# Patient Record
Sex: Female | Born: 1960 | Race: White | Hispanic: No | Marital: Married
Health system: Southern US, Community
[De-identification: ages and names within clinical notes are randomized; demographics above are authoritative.]

---

## 1998-01-27 ENCOUNTER — Other Ambulatory Visit: Admission: RE | Admit: 1998-01-27 | Discharge: 1998-01-27 | Payer: Self-pay | Admitting: Obstetrics and Gynecology

## 1999-02-13 ENCOUNTER — Other Ambulatory Visit: Admission: RE | Admit: 1999-02-13 | Discharge: 1999-02-13 | Payer: Self-pay | Admitting: Obstetrics and Gynecology

## 2000-04-15 ENCOUNTER — Other Ambulatory Visit: Admission: RE | Admit: 2000-04-15 | Discharge: 2000-04-15 | Payer: Self-pay | Admitting: Obstetrics and Gynecology

## 2001-04-28 ENCOUNTER — Other Ambulatory Visit: Admission: RE | Admit: 2001-04-28 | Discharge: 2001-04-28 | Payer: Self-pay | Admitting: Obstetrics and Gynecology

## 2002-04-29 ENCOUNTER — Other Ambulatory Visit: Admission: RE | Admit: 2002-04-29 | Discharge: 2002-04-29 | Payer: Self-pay | Admitting: Obstetrics and Gynecology

## 2003-05-04 ENCOUNTER — Other Ambulatory Visit: Admission: RE | Admit: 2003-05-04 | Discharge: 2003-05-04 | Payer: Self-pay | Admitting: Obstetrics and Gynecology

## 2003-05-05 ENCOUNTER — Ambulatory Visit (HOSPITAL_COMMUNITY): Admission: RE | Admit: 2003-05-05 | Discharge: 2003-05-05 | Payer: Self-pay | Admitting: Obstetrics and Gynecology

## 2004-05-30 ENCOUNTER — Other Ambulatory Visit: Admission: RE | Admit: 2004-05-30 | Discharge: 2004-05-30 | Payer: Self-pay | Admitting: Obstetrics and Gynecology

## 2005-07-09 ENCOUNTER — Ambulatory Visit (HOSPITAL_COMMUNITY): Admission: RE | Admit: 2005-07-09 | Discharge: 2005-07-09 | Payer: Self-pay | Admitting: Obstetrics and Gynecology

## 2015-12-14 DIAGNOSIS — G43009 Migraine without aura, not intractable, without status migrainosus: Secondary | ICD-10-CM | POA: Insufficient documentation

## 2015-12-14 DIAGNOSIS — G4733 Obstructive sleep apnea (adult) (pediatric): Secondary | ICD-10-CM | POA: Insufficient documentation

## 2017-09-26 ENCOUNTER — Ambulatory Visit (INDEPENDENT_AMBULATORY_CARE_PROVIDER_SITE_OTHER): Payer: BLUE CROSS/BLUE SHIELD | Admitting: Licensed Clinical Social Worker

## 2017-09-26 DIAGNOSIS — F419 Anxiety disorder, unspecified: Secondary | ICD-10-CM | POA: Diagnosis not present

## 2017-10-07 ENCOUNTER — Ambulatory Visit (INDEPENDENT_AMBULATORY_CARE_PROVIDER_SITE_OTHER): Payer: BLUE CROSS/BLUE SHIELD | Admitting: Licensed Clinical Social Worker

## 2017-10-07 DIAGNOSIS — F419 Anxiety disorder, unspecified: Secondary | ICD-10-CM

## 2017-10-22 ENCOUNTER — Ambulatory Visit (INDEPENDENT_AMBULATORY_CARE_PROVIDER_SITE_OTHER): Payer: BLUE CROSS/BLUE SHIELD | Admitting: Licensed Clinical Social Worker

## 2017-10-22 DIAGNOSIS — F419 Anxiety disorder, unspecified: Secondary | ICD-10-CM | POA: Diagnosis not present

## 2017-11-05 ENCOUNTER — Ambulatory Visit (INDEPENDENT_AMBULATORY_CARE_PROVIDER_SITE_OTHER): Payer: BLUE CROSS/BLUE SHIELD | Admitting: Licensed Clinical Social Worker

## 2017-11-05 DIAGNOSIS — F419 Anxiety disorder, unspecified: Secondary | ICD-10-CM | POA: Diagnosis not present

## 2017-11-28 ENCOUNTER — Ambulatory Visit (INDEPENDENT_AMBULATORY_CARE_PROVIDER_SITE_OTHER): Payer: BLUE CROSS/BLUE SHIELD | Admitting: Licensed Clinical Social Worker

## 2017-11-28 DIAGNOSIS — F419 Anxiety disorder, unspecified: Secondary | ICD-10-CM | POA: Diagnosis not present

## 2017-12-11 ENCOUNTER — Ambulatory Visit (INDEPENDENT_AMBULATORY_CARE_PROVIDER_SITE_OTHER): Payer: BLUE CROSS/BLUE SHIELD | Admitting: Licensed Clinical Social Worker

## 2017-12-11 DIAGNOSIS — F419 Anxiety disorder, unspecified: Secondary | ICD-10-CM

## 2017-12-24 ENCOUNTER — Ambulatory Visit (INDEPENDENT_AMBULATORY_CARE_PROVIDER_SITE_OTHER): Payer: BLUE CROSS/BLUE SHIELD | Admitting: Licensed Clinical Social Worker

## 2017-12-24 DIAGNOSIS — F419 Anxiety disorder, unspecified: Secondary | ICD-10-CM | POA: Diagnosis not present

## 2018-01-07 ENCOUNTER — Ambulatory Visit (INDEPENDENT_AMBULATORY_CARE_PROVIDER_SITE_OTHER): Payer: BLUE CROSS/BLUE SHIELD | Admitting: Licensed Clinical Social Worker

## 2018-01-07 DIAGNOSIS — F419 Anxiety disorder, unspecified: Secondary | ICD-10-CM | POA: Diagnosis not present

## 2018-01-21 ENCOUNTER — Ambulatory Visit (INDEPENDENT_AMBULATORY_CARE_PROVIDER_SITE_OTHER): Payer: BLUE CROSS/BLUE SHIELD | Admitting: Licensed Clinical Social Worker

## 2018-01-21 DIAGNOSIS — F419 Anxiety disorder, unspecified: Secondary | ICD-10-CM | POA: Diagnosis not present

## 2018-02-04 ENCOUNTER — Ambulatory Visit (INDEPENDENT_AMBULATORY_CARE_PROVIDER_SITE_OTHER): Payer: BLUE CROSS/BLUE SHIELD | Admitting: Licensed Clinical Social Worker

## 2018-02-04 DIAGNOSIS — F419 Anxiety disorder, unspecified: Secondary | ICD-10-CM

## 2018-02-25 ENCOUNTER — Ambulatory Visit (INDEPENDENT_AMBULATORY_CARE_PROVIDER_SITE_OTHER): Payer: BLUE CROSS/BLUE SHIELD | Admitting: Licensed Clinical Social Worker

## 2018-02-25 DIAGNOSIS — F419 Anxiety disorder, unspecified: Secondary | ICD-10-CM

## 2018-03-13 ENCOUNTER — Ambulatory Visit (INDEPENDENT_AMBULATORY_CARE_PROVIDER_SITE_OTHER): Payer: BLUE CROSS/BLUE SHIELD | Admitting: Licensed Clinical Social Worker

## 2018-03-13 DIAGNOSIS — F419 Anxiety disorder, unspecified: Secondary | ICD-10-CM

## 2018-03-25 ENCOUNTER — Ambulatory Visit (INDEPENDENT_AMBULATORY_CARE_PROVIDER_SITE_OTHER): Payer: BLUE CROSS/BLUE SHIELD | Admitting: Licensed Clinical Social Worker

## 2018-03-25 DIAGNOSIS — F419 Anxiety disorder, unspecified: Secondary | ICD-10-CM

## 2018-04-30 ENCOUNTER — Ambulatory Visit (INDEPENDENT_AMBULATORY_CARE_PROVIDER_SITE_OTHER): Payer: BLUE CROSS/BLUE SHIELD | Admitting: Licensed Clinical Social Worker

## 2018-04-30 DIAGNOSIS — F419 Anxiety disorder, unspecified: Secondary | ICD-10-CM

## 2018-05-28 ENCOUNTER — Ambulatory Visit (INDEPENDENT_AMBULATORY_CARE_PROVIDER_SITE_OTHER): Payer: BLUE CROSS/BLUE SHIELD | Admitting: Licensed Clinical Social Worker

## 2018-05-28 DIAGNOSIS — F419 Anxiety disorder, unspecified: Secondary | ICD-10-CM | POA: Diagnosis not present

## 2018-06-25 ENCOUNTER — Ambulatory Visit (INDEPENDENT_AMBULATORY_CARE_PROVIDER_SITE_OTHER): Payer: PRIVATE HEALTH INSURANCE | Admitting: Licensed Clinical Social Worker

## 2018-06-25 DIAGNOSIS — F419 Anxiety disorder, unspecified: Secondary | ICD-10-CM

## 2018-07-23 ENCOUNTER — Ambulatory Visit (INDEPENDENT_AMBULATORY_CARE_PROVIDER_SITE_OTHER): Payer: PRIVATE HEALTH INSURANCE | Admitting: Licensed Clinical Social Worker

## 2018-07-23 DIAGNOSIS — F419 Anxiety disorder, unspecified: Secondary | ICD-10-CM

## 2018-08-21 ENCOUNTER — Ambulatory Visit: Payer: Self-pay | Admitting: Licensed Clinical Social Worker

## 2018-08-27 ENCOUNTER — Ambulatory Visit (INDEPENDENT_AMBULATORY_CARE_PROVIDER_SITE_OTHER): Payer: PRIVATE HEALTH INSURANCE | Admitting: Licensed Clinical Social Worker

## 2018-08-27 DIAGNOSIS — F419 Anxiety disorder, unspecified: Secondary | ICD-10-CM

## 2018-09-24 ENCOUNTER — Ambulatory Visit: Payer: Self-pay | Admitting: Licensed Clinical Social Worker

## 2018-09-24 ENCOUNTER — Ambulatory Visit (INDEPENDENT_AMBULATORY_CARE_PROVIDER_SITE_OTHER): Payer: PRIVATE HEALTH INSURANCE | Admitting: Licensed Clinical Social Worker

## 2018-09-24 DIAGNOSIS — F419 Anxiety disorder, unspecified: Secondary | ICD-10-CM

## 2018-10-22 ENCOUNTER — Ambulatory Visit (INDEPENDENT_AMBULATORY_CARE_PROVIDER_SITE_OTHER): Payer: PRIVATE HEALTH INSURANCE | Admitting: Licensed Clinical Social Worker

## 2018-10-22 DIAGNOSIS — F3341 Major depressive disorder, recurrent, in partial remission: Secondary | ICD-10-CM | POA: Diagnosis not present

## 2018-11-26 ENCOUNTER — Ambulatory Visit (INDEPENDENT_AMBULATORY_CARE_PROVIDER_SITE_OTHER): Payer: PRIVATE HEALTH INSURANCE | Admitting: Licensed Clinical Social Worker

## 2018-11-26 DIAGNOSIS — F3341 Major depressive disorder, recurrent, in partial remission: Secondary | ICD-10-CM

## 2018-12-24 ENCOUNTER — Ambulatory Visit (INDEPENDENT_AMBULATORY_CARE_PROVIDER_SITE_OTHER): Payer: PRIVATE HEALTH INSURANCE | Admitting: Licensed Clinical Social Worker

## 2018-12-24 DIAGNOSIS — F331 Major depressive disorder, recurrent, moderate: Secondary | ICD-10-CM | POA: Diagnosis not present

## 2019-01-28 ENCOUNTER — Ambulatory Visit (INDEPENDENT_AMBULATORY_CARE_PROVIDER_SITE_OTHER): Payer: PRIVATE HEALTH INSURANCE | Admitting: Licensed Clinical Social Worker

## 2019-01-28 DIAGNOSIS — F3341 Major depressive disorder, recurrent, in partial remission: Secondary | ICD-10-CM

## 2019-02-25 ENCOUNTER — Ambulatory Visit (INDEPENDENT_AMBULATORY_CARE_PROVIDER_SITE_OTHER): Payer: PRIVATE HEALTH INSURANCE | Admitting: Licensed Clinical Social Worker

## 2019-02-25 DIAGNOSIS — F3341 Major depressive disorder, recurrent, in partial remission: Secondary | ICD-10-CM | POA: Diagnosis not present

## 2019-03-25 ENCOUNTER — Ambulatory Visit (INDEPENDENT_AMBULATORY_CARE_PROVIDER_SITE_OTHER): Payer: PRIVATE HEALTH INSURANCE | Admitting: Licensed Clinical Social Worker

## 2019-03-25 DIAGNOSIS — F3341 Major depressive disorder, recurrent, in partial remission: Secondary | ICD-10-CM | POA: Diagnosis not present

## 2019-04-29 ENCOUNTER — Ambulatory Visit: Payer: PRIVATE HEALTH INSURANCE | Admitting: Licensed Clinical Social Worker

## 2019-05-27 ENCOUNTER — Ambulatory Visit (INDEPENDENT_AMBULATORY_CARE_PROVIDER_SITE_OTHER): Payer: PRIVATE HEALTH INSURANCE | Admitting: Licensed Clinical Social Worker

## 2019-05-27 DIAGNOSIS — F3341 Major depressive disorder, recurrent, in partial remission: Secondary | ICD-10-CM

## 2019-07-01 ENCOUNTER — Ambulatory Visit (INDEPENDENT_AMBULATORY_CARE_PROVIDER_SITE_OTHER): Payer: PRIVATE HEALTH INSURANCE | Admitting: Licensed Clinical Social Worker

## 2019-07-01 DIAGNOSIS — F3341 Major depressive disorder, recurrent, in partial remission: Secondary | ICD-10-CM

## 2019-07-23 ENCOUNTER — Ambulatory Visit: Payer: PRIVATE HEALTH INSURANCE | Admitting: Psychology

## 2019-08-14 ENCOUNTER — Ambulatory Visit: Payer: PRIVATE HEALTH INSURANCE | Admitting: Psychology

## 2019-08-21 ENCOUNTER — Ambulatory Visit (INDEPENDENT_AMBULATORY_CARE_PROVIDER_SITE_OTHER): Payer: PRIVATE HEALTH INSURANCE | Admitting: Psychology

## 2019-08-21 ENCOUNTER — Ambulatory Visit: Payer: PRIVATE HEALTH INSURANCE | Admitting: Psychology

## 2019-08-21 DIAGNOSIS — F330 Major depressive disorder, recurrent, mild: Secondary | ICD-10-CM | POA: Diagnosis not present

## 2019-09-18 ENCOUNTER — Ambulatory Visit (INDEPENDENT_AMBULATORY_CARE_PROVIDER_SITE_OTHER): Payer: PRIVATE HEALTH INSURANCE | Admitting: Psychology

## 2019-09-18 DIAGNOSIS — F330 Major depressive disorder, recurrent, mild: Secondary | ICD-10-CM | POA: Diagnosis not present

## 2019-10-16 ENCOUNTER — Ambulatory Visit (INDEPENDENT_AMBULATORY_CARE_PROVIDER_SITE_OTHER): Payer: PRIVATE HEALTH INSURANCE | Admitting: Psychology

## 2019-10-16 DIAGNOSIS — F330 Major depressive disorder, recurrent, mild: Secondary | ICD-10-CM

## 2019-10-16 DIAGNOSIS — F411 Generalized anxiety disorder: Secondary | ICD-10-CM

## 2019-11-13 ENCOUNTER — Ambulatory Visit (INDEPENDENT_AMBULATORY_CARE_PROVIDER_SITE_OTHER): Payer: PRIVATE HEALTH INSURANCE | Admitting: Psychology

## 2019-11-13 DIAGNOSIS — F330 Major depressive disorder, recurrent, mild: Secondary | ICD-10-CM | POA: Diagnosis not present

## 2019-11-13 DIAGNOSIS — F411 Generalized anxiety disorder: Secondary | ICD-10-CM | POA: Diagnosis not present

## 2019-12-04 ENCOUNTER — Ambulatory Visit (INDEPENDENT_AMBULATORY_CARE_PROVIDER_SITE_OTHER): Payer: PRIVATE HEALTH INSURANCE | Admitting: Psychology

## 2019-12-04 DIAGNOSIS — F411 Generalized anxiety disorder: Secondary | ICD-10-CM

## 2019-12-04 DIAGNOSIS — F330 Major depressive disorder, recurrent, mild: Secondary | ICD-10-CM

## 2020-01-01 ENCOUNTER — Other Ambulatory Visit: Payer: Self-pay | Admitting: *Deleted

## 2020-01-01 ENCOUNTER — Ambulatory Visit (INDEPENDENT_AMBULATORY_CARE_PROVIDER_SITE_OTHER): Payer: PRIVATE HEALTH INSURANCE | Admitting: Sports Medicine

## 2020-01-01 ENCOUNTER — Encounter: Payer: Self-pay | Admitting: Sports Medicine

## 2020-01-01 ENCOUNTER — Other Ambulatory Visit: Payer: Self-pay

## 2020-01-01 DIAGNOSIS — L600 Ingrowing nail: Secondary | ICD-10-CM | POA: Diagnosis not present

## 2020-01-01 DIAGNOSIS — M79674 Pain in right toe(s): Secondary | ICD-10-CM | POA: Diagnosis not present

## 2020-01-01 DIAGNOSIS — L84 Corns and callosities: Secondary | ICD-10-CM | POA: Diagnosis not present

## 2020-01-01 NOTE — Progress Notes (Signed)
Subjective: Alexandria Schmidt is a 59 y.o. female patient presents to office today complaining of a moderately painful and callus over lesion at the right hallux lateral nail border reports that her dermatologist froze the skin at the area and everywhere he of this year but it did not help reports that she is having problems with this corner for the last year and reports that they even took a skin scraping from the area that was benign.  Patient also admits that she has some callus to the fourth toe that she has had all of her life.  Patient denies any significant redness warmth drainage or signs of infection from the affected areas.  Patient denies fever/chills/nausea/vomitting/any other related constitutional symptoms at this time.  Review of systems noncontributory.  Patient Active Problem List   Diagnosis Date Noted   Migraine without aura and without status migrainosus, not intractable 12/14/2015   OSA (obstructive sleep apnea) 12/14/2015    No current outpatient medications on file prior to visit.   No current facility-administered medications on file prior to visit.    No Known Allergies  Objective:  There were no vitals filed for this visit.  General: Well developed, nourished, in no acute distress, alert and oriented x3   Dermatology: Skin is warm, dry and supple bilateral.  Right hallux nail appears to be chronically incurvated with hyperkeratosis formation at the distal aspects of the lateral nail border. (-) Erythema. (+) Edema. (-) serosanguous  drainage present. The remaining nails appear unremarkable at this time.  There is a small pinch callus noted to the plantar surface of the left fourth toe.  There are no open sores, lesions or other signs of infection present.  Vascular: Dorsalis Pedis artery and Posterior Tibial artery pedal pulses are 2/4 bilateral with immedate capillary fill time. Pedal hair growth present. No lower extremity edema.   Neruologic: Grossly intact via  light touch bilateral.  Musculoskeletal: Tenderness to palpation of the right hallux lateral nail fold.  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  Muscular strength within normal limits in all groups bilateral.   Assesement and Plan: Problem List Items Addressed This Visit    None    Visit Diagnoses    Ingrown nail    -  Primary   Toe pain, right       Callus          -Discussed treatment alternatives and plan of care; Explained permanent/temporary nail avulsion and post procedure course to patient. Patient elects for PNA right hallux lateral margin - After a verbal and written consent, injected 3 ml of a 50:50 mixture of 2% plain lidocaine and 0.5% plain marcaine in a normal hallux block fashion. Next, a betadine prep was performed. Anesthesia was tested and found to be appropriate. The offending right hallux lateral nail border was then incised from the hyponychium to the epinychium. The offending nail border was removed and cleared from the field. The area was curretted for any remaining nail or spicules. Phenol application performed and the area was then flushed with alcohol and dressed with antibiotic cream and a dry sterile dressing. -Patient was instructed to leave the dressing intact for today and begin soaking  in a weak solution of betadine or Epsom salt and water tomorrow. Patient was instructed to soak for 15-20 minutes each day and apply neosporin/corticosporin and a gauze or bandaid dressing each day. -Patient was instructed to monitor the toe for signs of infection and return to office if toe  becomes red, hot or swollen. -Advised ice, elevation, and tylenol or motrin if needed for pain.  -Dispensed foot miracle cream for left fourth toe and advised patient to use after bath or shower if there is any buildup of callus skin may use a sterile or clean nail nipper to trim off any hard skin areas however if this becomes unmanageable may return to office for a debridement of the  callus to the toes like I performed today at no additional charge using a sterile nail nipper -Patient is to return in 2 weeks for follow up care/nail check or sooner if problems arise.  Asencion Islam, DPM

## 2020-01-01 NOTE — Patient Instructions (Signed)

## 2020-01-08 ENCOUNTER — Ambulatory Visit (INDEPENDENT_AMBULATORY_CARE_PROVIDER_SITE_OTHER): Payer: PRIVATE HEALTH INSURANCE | Admitting: Psychology

## 2020-01-08 DIAGNOSIS — F330 Major depressive disorder, recurrent, mild: Secondary | ICD-10-CM

## 2020-01-08 DIAGNOSIS — F411 Generalized anxiety disorder: Secondary | ICD-10-CM

## 2020-01-15 ENCOUNTER — Other Ambulatory Visit: Payer: Self-pay

## 2020-01-15 ENCOUNTER — Ambulatory Visit (INDEPENDENT_AMBULATORY_CARE_PROVIDER_SITE_OTHER): Payer: PRIVATE HEALTH INSURANCE | Admitting: Sports Medicine

## 2020-01-15 ENCOUNTER — Encounter: Payer: Self-pay | Admitting: Sports Medicine

## 2020-01-15 DIAGNOSIS — Z9889 Other specified postprocedural states: Secondary | ICD-10-CM

## 2020-01-15 DIAGNOSIS — M79674 Pain in right toe(s): Secondary | ICD-10-CM

## 2020-01-15 NOTE — Progress Notes (Signed)
Subjective: Alexandria Schmidt is a 59 y.o. female patient returns to office today for follow up evaluation after having Right Hallux lateral permanent nail avulsion performed on (01/01/2020). Patient has been soaking using epsom salt and applying topical antibiotic covered with bandaid daily. Patient deniesfever/chills/nausea/vomitting/any other related constitutional symptoms at this time.  Patient Active Problem List   Diagnosis Date Noted  . Migraine without aura and without status migrainosus, not intractable 12/14/2015  . OSA (obstructive sleep apnea) 12/14/2015    Current Outpatient Medications on File Prior to Visit  Medication Sig Dispense Refill  . buPROPion (WELLBUTRIN XL) 150 MG 24 hr tablet Take 150 mg by mouth at bedtime.    . cetirizine (ZYRTEC ALLERGY) 10 MG tablet Zyrtec 10 mg tablet  Take 1 tablet every day by oral route.    . cloNIDine (CATAPRES) 0.1 MG tablet Take 0.1 mg by mouth at bedtime.    Marland Kitchen desvenlafaxine (PRISTIQ) 100 MG 24 hr tablet Pristiq 100 mg tablet,extended release    . fluconazole (DIFLUCAN) 200 MG tablet     . fluocinonide (LIDEX) 0.05 % external solution     . ketoconazole (NIZORAL) 2 % shampoo ketoconazole 2 % shampoo  USE AS DIRECTED    . minocycline (MINOCIN) 100 MG capsule Take by mouth daily.    . mometasone (NASONEX) 50 MCG/ACT nasal spray mometasone 50 mcg/actuation nasal spray    . montelukast (SINGULAIR) 10 MG tablet Take 10 mg by mouth at bedtime.    Marland Kitchen nystatin cream (MYCOSTATIN) nystatin 100,000 unit/gram topical cream  MIX WITH EQUAL PARTS OF TRIAMCINOLONE AND APPLY TO AFFECTED AREA TWICE DAILY FOR 7 TO 10 DAYS AS NEEDED.    Marland Kitchen Sod Picosulfate-Mag Ox-Cit Acd (PREPOPIK) 10-3.5-12 MG-GM-GM PACK Prepopik 10 mg-3.5 gram-12 gram oral powder packet    . testosterone cypionate (DEPOTESTOSTERONE CYPIONATE) 200 MG/ML injection testosterone cypionate 200 mg/mL intramuscular oil  Given:    GM    . triamcinolone cream (KENALOG) 0.1 %     . valACYclovir  (VALTREX) 1000 MG tablet Take 1,000 mg by mouth 3 (three) times daily.    Marland Kitchen venlafaxine XR (EFFEXOR-XR) 75 MG 24 hr capsule Take 75 mg by mouth daily.     No current facility-administered medications on file prior to visit.    No Known Allergies  Objective:  General: Well developed, nourished, in no acute distress, alert and oriented x3   Dermatology: Skin is warm, dry and supple bilateral.  Right hallux lateral nail bed appears to be clean, dry, with mild granular tissue and surrounding eschar/scab. (-) Erythema. (-) Edema. (-) serosanguous drainage present. The remaining nails appear unremarkable at this time. There are no other lesions or other signs of infection  present.  Neurovascular status: Intact. No lower extremity swelling; No pain with calf compression bilateral.  Musculoskeletal: Decreased tenderness to palpation of the right hallux lateral hallux nail fold(s). Muscular strength within normal limits bilateral.   Assesement and Plan: Problem List Items Addressed This Visit    None    Visit Diagnoses    S/P nail surgery    -  Primary   Toe pain, right          -Examined patient  -Discussed with patient long-term care after nail procedure advised patient since he is healing well may discontinue soaking and leave open to air at night -May still continue to use a Band-Aid during the day when in shoes -Patient was instructed to monitor the toe for reoccurrence and signs of infection; Patient  advised to return to office or go to ER if toe becomes red, hot or swollen. -Patient is to return as needed or sooner if problems arise.  Asencion Islam, DPM

## 2020-02-19 ENCOUNTER — Ambulatory Visit (INDEPENDENT_AMBULATORY_CARE_PROVIDER_SITE_OTHER): Payer: PRIVATE HEALTH INSURANCE | Admitting: Psychology

## 2020-02-19 DIAGNOSIS — F330 Major depressive disorder, recurrent, mild: Secondary | ICD-10-CM

## 2020-02-19 DIAGNOSIS — F411 Generalized anxiety disorder: Secondary | ICD-10-CM

## 2020-04-15 ENCOUNTER — Ambulatory Visit (INDEPENDENT_AMBULATORY_CARE_PROVIDER_SITE_OTHER): Payer: PRIVATE HEALTH INSURANCE | Admitting: Psychology

## 2020-04-15 DIAGNOSIS — F330 Major depressive disorder, recurrent, mild: Secondary | ICD-10-CM | POA: Diagnosis not present

## 2020-04-15 DIAGNOSIS — F411 Generalized anxiety disorder: Secondary | ICD-10-CM

## 2020-06-03 ENCOUNTER — Ambulatory Visit (INDEPENDENT_AMBULATORY_CARE_PROVIDER_SITE_OTHER): Payer: PRIVATE HEALTH INSURANCE | Admitting: Psychology

## 2020-06-03 DIAGNOSIS — F330 Major depressive disorder, recurrent, mild: Secondary | ICD-10-CM | POA: Diagnosis not present

## 2020-06-03 DIAGNOSIS — F411 Generalized anxiety disorder: Secondary | ICD-10-CM | POA: Diagnosis not present

## 2020-07-07 ENCOUNTER — Other Ambulatory Visit: Payer: Self-pay | Admitting: Family Medicine

## 2020-07-07 DIAGNOSIS — Z09 Encounter for follow-up examination after completed treatment for conditions other than malignant neoplasm: Secondary | ICD-10-CM

## 2020-07-29 ENCOUNTER — Other Ambulatory Visit: Payer: Self-pay | Admitting: Family Medicine

## 2020-07-29 DIAGNOSIS — N6001 Solitary cyst of right breast: Secondary | ICD-10-CM

## 2020-08-03 ENCOUNTER — Other Ambulatory Visit: Payer: Self-pay

## 2020-08-03 ENCOUNTER — Ambulatory Visit
Admission: RE | Admit: 2020-08-03 | Discharge: 2020-08-03 | Disposition: A | Discharge status: Home / Self Care | Payer: PRIVATE HEALTH INSURANCE | Source: Ambulatory Visit | Attending: Family Medicine | Admitting: Family Medicine

## 2020-08-03 DIAGNOSIS — N6001 Solitary cyst of right breast: Secondary | ICD-10-CM

## 2020-08-05 ENCOUNTER — Ambulatory Visit (INDEPENDENT_AMBULATORY_CARE_PROVIDER_SITE_OTHER): Payer: PRIVATE HEALTH INSURANCE | Admitting: Psychology

## 2020-08-05 DIAGNOSIS — F330 Major depressive disorder, recurrent, mild: Secondary | ICD-10-CM | POA: Diagnosis not present

## 2020-08-05 DIAGNOSIS — F411 Generalized anxiety disorder: Secondary | ICD-10-CM | POA: Diagnosis not present

## 2020-09-30 ENCOUNTER — Ambulatory Visit (INDEPENDENT_AMBULATORY_CARE_PROVIDER_SITE_OTHER): Payer: No Typology Code available for payment source | Admitting: Psychology

## 2020-09-30 DIAGNOSIS — F411 Generalized anxiety disorder: Secondary | ICD-10-CM

## 2020-09-30 DIAGNOSIS — F330 Major depressive disorder, recurrent, mild: Secondary | ICD-10-CM | POA: Diagnosis not present

## 2020-12-02 ENCOUNTER — Ambulatory Visit: Payer: No Typology Code available for payment source | Admitting: Psychology

## 2020-12-26 ENCOUNTER — Ambulatory Visit: Payer: No Typology Code available for payment source | Admitting: Psychology

## 2021-02-03 ENCOUNTER — Ambulatory Visit (INDEPENDENT_AMBULATORY_CARE_PROVIDER_SITE_OTHER): Payer: No Typology Code available for payment source | Admitting: Psychology

## 2021-02-03 DIAGNOSIS — F411 Generalized anxiety disorder: Secondary | ICD-10-CM | POA: Diagnosis not present

## 2021-02-03 DIAGNOSIS — F330 Major depressive disorder, recurrent, mild: Secondary | ICD-10-CM | POA: Diagnosis not present

## 2021-03-14 ENCOUNTER — Ambulatory Visit: Payer: No Typology Code available for payment source

## 2021-03-14 ENCOUNTER — Encounter: Payer: Self-pay | Admitting: Sports Medicine

## 2021-03-14 ENCOUNTER — Ambulatory Visit (INDEPENDENT_AMBULATORY_CARE_PROVIDER_SITE_OTHER): Payer: No Typology Code available for payment source | Admitting: Sports Medicine

## 2021-03-14 DIAGNOSIS — M2061 Acquired deformities of toe(s), unspecified, right foot: Secondary | ICD-10-CM | POA: Diagnosis not present

## 2021-03-14 DIAGNOSIS — L600 Ingrowing nail: Secondary | ICD-10-CM | POA: Diagnosis not present

## 2021-03-14 DIAGNOSIS — M79674 Pain in right toe(s): Secondary | ICD-10-CM

## 2021-03-14 DIAGNOSIS — L84 Corns and callosities: Secondary | ICD-10-CM

## 2021-03-14 DIAGNOSIS — Z9889 Other specified postprocedural states: Secondary | ICD-10-CM

## 2021-03-14 MED ORDER — NEOMYCIN-POLYMYXIN-HC 3.5-10000-1 OT SOLN: OTIC | 0 refills | Status: AC

## 2021-03-14 NOTE — Progress Notes (Signed)
Subjective: Alexandria Schmidt is a 60 y.o. female patient returns to office today for follow up evaluation after having Right Hallux lateral permanent nail avulsion performed on (01/01/2020). Patient reports that she still gets hard skin at the nail fold and for the last 6 weeks it has gotten worse and throbs. Patient denies redness, warmth, drainage. No other issues noted.   Patient Active Problem List   Diagnosis Date Noted   Migraine without aura and without status migrainosus, not intractable 12/14/2015   OSA (obstructive sleep apnea) 12/14/2015    Current Outpatient Medications on File Prior to Visit  Medication Sig Dispense Refill   amoxicillin (AMOXIL) 500 MG capsule Take 500 mg by mouth 3 (three) times daily.     betamethasone, augmented, (DIPROLENE) 0.05 % lotion betamethasone, augmented 0.05 % lotion     buPROPion (WELLBUTRIN XL) 150 MG 24 hr tablet Take 150 mg by mouth at bedtime.     cetirizine (ZYRTEC ALLERGY) 10 MG tablet Zyrtec 10 mg tablet  Take 1 tablet every day by oral route.     cloNIDine (CATAPRES) 0.1 MG tablet Take 0.1 mg by mouth at bedtime.     desvenlafaxine (PRISTIQ) 100 MG 24 hr tablet Pristiq 100 mg tablet,extended release     fluconazole (DIFLUCAN) 200 MG tablet      fluocinonide (LIDEX) 0.05 % external solution      ketoconazole (NIZORAL) 2 % shampoo ketoconazole 2 % shampoo  USE AS DIRECTED     methylPREDNISolone (MEDROL DOSEPAK) 4 MG TBPK tablet Take by mouth.     minocycline (MINOCIN) 100 MG capsule Take by mouth daily.     mometasone (NASONEX) 50 MCG/ACT nasal spray mometasone 50 mcg/actuation nasal spray     montelukast (SINGULAIR) 10 MG tablet Take 10 mg by mouth at bedtime.     nystatin cream (MYCOSTATIN) nystatin 100,000 unit/gram topical cream  MIX WITH EQUAL PARTS OF TRIAMCINOLONE AND APPLY TO AFFECTED AREA TWICE DAILY FOR 7 TO 10 DAYS AS NEEDED.     promethazine-dextromethorphan (PROMETHAZINE-DM) 6.25-15 MG/5ML syrup Take by mouth.     Sod  Picosulfate-Mag Ox-Cit Acd (PREPOPIK) 10-3.5-12 MG-GM-GM PACK Prepopik 10 mg-3.5 gram-12 gram oral powder packet     testosterone cypionate (DEPOTESTOSTERONE CYPIONATE) 200 MG/ML injection testosterone cypionate 200 mg/mL intramuscular oil  Given:    GM     triamcinolone cream (KENALOG) 0.1 %      valACYclovir (VALTREX) 1000 MG tablet Take 1,000 mg by mouth 3 (three) times daily.     venlafaxine XR (EFFEXOR-XR) 75 MG 24 hr capsule Take 75 mg by mouth daily.     No current facility-administered medications on file prior to visit.    No Known Allergies  Objective:  General: Well developed, nourished, in no acute distress, alert and oriented x3   Dermatology: Skin is warm, dry and supple bilateral.  Right hallux lateral nail bed appears to be clean, dry with reactive callus vs scar to the lateral nail fold and puffiness without warmth, no active drainage, no other acute signs of infection.  There are no other lesions or other signs of infection  present.  Neurovascular status: Intact. No lower extremity swelling; No pain with calf compression bilateral.  Musculoskeletal:Minimal tenderness to palpation of the right hallux lateral hallux nail fold(s). Muscular strength within normal limits bilateral.   Assesement and Plan: Problem List Items Addressed This Visit   None Visit Diagnoses     Toe pain, right    -  Primary   Acquired  deformity of right toe       Callus       Ingrown nail       S/P nail surgery           -Examined patient  -Rx Corticosporin -Advised patient that if this does not help may need to repeat nail avulsion with exicison of scar or callus at nail fold with phenol at no charge -Patient was instructed to monitor the toe for reoccurrence and signs of infection -Return in 10-14 days for toe check possible procedure.   Asencion Islam, DPM

## 2021-03-24 ENCOUNTER — Encounter: Payer: Self-pay | Admitting: Sports Medicine

## 2021-03-24 ENCOUNTER — Ambulatory Visit (INDEPENDENT_AMBULATORY_CARE_PROVIDER_SITE_OTHER): Payer: No Typology Code available for payment source | Admitting: Sports Medicine

## 2021-03-24 DIAGNOSIS — M79674 Pain in right toe(s): Secondary | ICD-10-CM

## 2021-03-24 DIAGNOSIS — Z9889 Other specified postprocedural states: Secondary | ICD-10-CM

## 2021-03-24 DIAGNOSIS — L600 Ingrowing nail: Secondary | ICD-10-CM

## 2021-03-24 NOTE — Progress Notes (Signed)
Subjective: Alexandria Schmidt is a 60 y.o. female patient presents to office today for follow-up evaluation of pain at right hallux lateral nail fold.  Patient reports that is a little better with the Corticosporin but she has noticed the skin peeling and is still very sore and puffy along the border.  Patient denies any changes with medical history since last encounter.   Patient Active Problem List   Diagnosis Date Noted   Migraine without aura and without status migrainosus, not intractable 12/14/2015   OSA (obstructive sleep apnea) 12/14/2015    Current Outpatient Medications on File Prior to Visit  Medication Sig Dispense Refill   amoxicillin (AMOXIL) 500 MG capsule Take 500 mg by mouth 3 (three) times daily.     betamethasone, augmented, (DIPROLENE) 0.05 % lotion betamethasone, augmented 0.05 % lotion     buPROPion (WELLBUTRIN XL) 150 MG 24 hr tablet Take 150 mg by mouth at bedtime.     cetirizine (ZYRTEC ALLERGY) 10 MG tablet Zyrtec 10 mg tablet  Take 1 tablet every day by oral route.     cloNIDine (CATAPRES) 0.1 MG tablet Take 0.1 mg by mouth at bedtime.     desvenlafaxine (PRISTIQ) 100 MG 24 hr tablet Pristiq 100 mg tablet,extended release     fluconazole (DIFLUCAN) 200 MG tablet      fluocinonide (LIDEX) 0.05 % external solution      ketoconazole (NIZORAL) 2 % shampoo ketoconazole 2 % shampoo  USE AS DIRECTED     methylPREDNISolone (MEDROL DOSEPAK) 4 MG TBPK tablet Take by mouth.     minocycline (MINOCIN) 100 MG capsule Take by mouth daily.     mometasone (NASONEX) 50 MCG/ACT nasal spray mometasone 50 mcg/actuation nasal spray     montelukast (SINGULAIR) 10 MG tablet Take 10 mg by mouth at bedtime.     neomycin-polymyxin-hydrocortisone (CORTISPORIN) 3.5-10000-1 OTIC suspension SMARTSIG:In Ear(s)     neomycin-polymyxin-hydrocortisone (CORTISPORIN) OTIC solution Apply 2 drops once daily after bath or shower to right great toe 10 mL 0   nystatin cream (MYCOSTATIN) nystatin  100,000 unit/gram topical cream  MIX WITH EQUAL PARTS OF TRIAMCINOLONE AND APPLY TO AFFECTED AREA TWICE DAILY FOR 7 TO 10 DAYS AS NEEDED.     promethazine-dextromethorphan (PROMETHAZINE-DM) 6.25-15 MG/5ML syrup Take by mouth.     Sod Picosulfate-Mag Ox-Cit Acd (PREPOPIK) 10-3.5-12 MG-GM-GM PACK Prepopik 10 mg-3.5 gram-12 gram oral powder packet     testosterone cypionate (DEPOTESTOSTERONE CYPIONATE) 200 MG/ML injection testosterone cypionate 200 mg/mL intramuscular oil  Given:    GM     triamcinolone cream (KENALOG) 0.1 %      valACYclovir (VALTREX) 1000 MG tablet Take 1,000 mg by mouth 3 (three) times daily.     venlafaxine XR (EFFEXOR-XR) 75 MG 24 hr capsule Take 75 mg by mouth daily.     No current facility-administered medications on file prior to visit.    No Known Allergies  Objective:  There were no vitals filed for this visit.  General: Well developed, nourished, in no acute distress, alert and oriented x3   Dermatology: Skin is warm, dry and supple bilateral.  Right hallux nail appears to be mildly incurvated with hyperkeratosis formation at the proximal aspect of the lateral nail border. (-) Erythema. (+) Edema especially at the proximal nail fold. (-) serosanguous drainage present. The remaining nails appear unremarkable at this time.    Vascular: Dorsalis Pedis artery and Posterior Tibial artery pedal pulses are 2/4 bilateral with immedate capillary fill time. Pedal hair growth present.  No lower extremity edema.   Neruologic: Grossly intact via light touch bilateral.  Musculoskeletal: Tenderness to palpation of the right hallux lateral nail fold.  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  Muscular strength within normal limits in all groups bilateral.   Assesement and Plan: Problem List Items Addressed This Visit   None Visit Diagnoses     Ingrown nail    -  Primary   Toe pain, right       S/P nail surgery           -Discussed treatment alternatives and plan of  care; Explained permanent/temporary nail avulsion and post procedure course to patient. Patient elects for PNA right hallux lateral margin this is a repeat procedure at no charge. - After a verbal and written consent, injected 3 ml of a 50:50 mixture of 2% plain lidocaine and 0.5% plain marcaine in a normal hallux block fashion. Next, a betadine prep was performed. Anesthesia was tested and found to be appropriate. The offending right hallux lateral nail border was then incised from the hyponychium to the epinychium. The offending nail border was removed and cleared from the field. The area was curretted for any remaining nail or spicules. Phenol application performed and the area was then flushed with alcohol and dressed with antibiotic cream and a dry sterile dressing. -Patient was instructed to leave the dressing intact for today and begin soaking  in a weak solution of betadine or Epsom salt and water tomorrow. Patient was instructed to soak for 15-20 minutes each day and apply corticosporin and a gauze or bandaid dressing each day. -Patient was instructed to monitor the toe for signs of infection and return to office if toe becomes red, hot or swollen. -Advised ice, elevation, and tylenol or motrin if needed for pain -Patient to return to office in 3 weeks for nail check or sooner if problems or issues arise Asencion Islam, DPM

## 2021-03-24 NOTE — Patient Instructions (Signed)

## 2021-04-19 ENCOUNTER — Encounter: Payer: Self-pay | Admitting: Sports Medicine

## 2021-04-19 ENCOUNTER — Ambulatory Visit (INDEPENDENT_AMBULATORY_CARE_PROVIDER_SITE_OTHER): Payer: No Typology Code available for payment source | Admitting: Sports Medicine

## 2021-04-19 ENCOUNTER — Other Ambulatory Visit: Payer: Self-pay

## 2021-04-19 DIAGNOSIS — Z9889 Other specified postprocedural states: Secondary | ICD-10-CM

## 2021-04-19 DIAGNOSIS — M79674 Pain in right toe(s): Secondary | ICD-10-CM

## 2021-04-19 DIAGNOSIS — L600 Ingrowing nail: Secondary | ICD-10-CM

## 2021-04-19 NOTE — Progress Notes (Signed)
Subjective: Alexandria Schmidt is a 61 y.o. female patient returns to office today for follow up evaluation after having Right Hallux lateral permanent nail avulsion performed on (03-24-21). Patient has been soaking using epsom salt and applying topical antibiotic covered with bandaid daily. Reports that the drainage is now better over the last 2 days no drainage on Band-Aid. Patient deniesfever/chills/nausea/vomitting/any other related constitutional symptoms at this time.    Patient Active Problem List   Diagnosis Date Noted   Migraine without aura and without status migrainosus, not intractable 12/14/2015   OSA (obstructive sleep apnea) 12/14/2015    Current Outpatient Medications on File Prior to Visit  Medication Sig Dispense Refill   amoxicillin (AMOXIL) 500 MG capsule Take 500 mg by mouth 3 (three) times daily.     betamethasone, augmented, (DIPROLENE) 0.05 % lotion betamethasone, augmented 0.05 % lotion     buPROPion (WELLBUTRIN XL) 150 MG 24 hr tablet Take 150 mg by mouth at bedtime.     cetirizine (ZYRTEC ALLERGY) 10 MG tablet Zyrtec 10 mg tablet  Take 1 tablet every day by oral route.     cloNIDine (CATAPRES) 0.1 MG tablet Take 0.1 mg by mouth at bedtime.     desvenlafaxine (PRISTIQ) 100 MG 24 hr tablet Pristiq 100 mg tablet,extended release     fluconazole (DIFLUCAN) 200 MG tablet      fluocinonide (LIDEX) 0.05 % external solution      ketoconazole (NIZORAL) 2 % shampoo ketoconazole 2 % shampoo  USE AS DIRECTED     methylPREDNISolone (MEDROL DOSEPAK) 4 MG TBPK tablet Take by mouth.     minocycline (MINOCIN) 100 MG capsule Take by mouth daily.     mometasone (NASONEX) 50 MCG/ACT nasal spray mometasone 50 mcg/actuation nasal spray     montelukast (SINGULAIR) 10 MG tablet Take 10 mg by mouth at bedtime.     neomycin-polymyxin-hydrocortisone (CORTISPORIN) 3.5-10000-1 OTIC suspension SMARTSIG:In Ear(s)     neomycin-polymyxin-hydrocortisone (CORTISPORIN) OTIC solution Apply 2 drops  once daily after bath or shower to right great toe 10 mL 0   nystatin cream (MYCOSTATIN) nystatin 100,000 unit/gram topical cream  MIX WITH EQUAL PARTS OF TRIAMCINOLONE AND APPLY TO AFFECTED AREA TWICE DAILY FOR 7 TO 10 DAYS AS NEEDED.     promethazine-dextromethorphan (PROMETHAZINE-DM) 6.25-15 MG/5ML syrup Take by mouth.     Sod Picosulfate-Mag Ox-Cit Acd (PREPOPIK) 10-3.5-12 MG-GM-GM PACK Prepopik 10 mg-3.5 gram-12 gram oral powder packet     testosterone cypionate (DEPOTESTOSTERONE CYPIONATE) 200 MG/ML injection testosterone cypionate 200 mg/mL intramuscular oil  Given:    GM     triamcinolone cream (KENALOG) 0.1 %      valACYclovir (VALTREX) 1000 MG tablet Take 1,000 mg by mouth 3 (three) times daily.     venlafaxine XR (EFFEXOR-XR) 75 MG 24 hr capsule Take 75 mg by mouth daily.     No current facility-administered medications on file prior to visit.    No Known Allergies  Objective:  General: Well developed, nourished, in no acute distress, alert and oriented x3   Dermatology: Skin is warm, dry and supple bilateral.  Right hallux lateral nail bed appears to be clean, dry, with mild granular tissue and surrounding eschar/scab. (-) Erythema. (-) Edema. (-) serosanguous drainage present. The remaining nails appear unremarkable at this time. There are no other lesions or other signs of infection  present.  Neurovascular status: Intact. No lower extremity swelling; No pain with calf compression bilateral.  Musculoskeletal: Decreased tenderness to palpation of the right hallux lateral  hallux nail fold(s). Muscular strength within normal limits bilateral.   Assesement and Plan: Problem List Items Addressed This Visit   None Visit Diagnoses     S/P nail surgery    -  Primary   Ingrown nail       Toe pain, right           -Examined patient  -Discussed with patient long-term care after nail procedure advised patient since he is healing well may discontinue soaking and leave open to  air at night -May still continue to use a Band-Aid during the day when in shoes -Patient was instructed to monitor the toe for reoccurrence and signs of infection; Patient advised to return to office or go to ER if toe becomes red, hot or swollen. -Patient is to return as needed or sooner if problems arise.  Asencion Islam, DPM

## 2021-04-28 ENCOUNTER — Ambulatory Visit (INDEPENDENT_AMBULATORY_CARE_PROVIDER_SITE_OTHER): Payer: No Typology Code available for payment source | Admitting: Psychology

## 2021-04-28 DIAGNOSIS — F411 Generalized anxiety disorder: Secondary | ICD-10-CM

## 2021-04-28 DIAGNOSIS — F330 Major depressive disorder, recurrent, mild: Secondary | ICD-10-CM | POA: Diagnosis not present

## 2021-04-28 NOTE — Progress Notes (Signed)
[facility] Counselor/Therapist Progress Note  Patient ID: Alexandria Schmidt, MRN: [id number],    Date: 04/28/2021  Time Spent: 60 minutes  Treatment Type: Individual Therapy  Reported Symptoms: history of depression and worry  Mental Status Exam: Appearance:  Casual     Behavior: Appropriate  Motor: Normal  Speech/Language:  Normal Rate  Affect: Appropriate  Mood: normal  Thought process: normal  Thought content:   WNL  Sensory/Perceptual disturbances:   WNL  Orientation: oriented to person, place, time/date, and situation  Attention: Good  Concentration: Good  Memory: WNL  Fund of knowledge:  Good  Insight:   Good  Judgment:  Good  Impulse Control: Good   Risk Assessment: Danger to Self:  No Self-injurious Behavior: No Danger to Others: No Duty to Warn:no Physical Aggression / Violence:No  Access to Firearms a concern: No  Gang Involvement:No   Subjective: The patient attended a face-to-face individual therapy session via video visit.  The patient gave permission for this session to be on video on WebEx.  The patient was at her hotel room alone and therapist was in the office.  The patient states that she and her husband are in [place] and for conference.  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  The patient has made excellent progress since she has began therapy and that she is no longer identifying as much with her job and is much better able to have healthy boundaries around it.  The patient states that she and was able to go out of town while some other people handled some things at work and this is a good growth point for her.  We talked about what her purpose might be when she retires and she is thinking that she would like to work with the family as of people who have Alzheimer's.  We discussed the possible challenges associated with going in that field and we also did some brainstorming  about what other options might be able to utilize her skills that she has already without her having to go back to school.  The patient feels like she is doing very well and she has moved sessions to every 3 months.  Interventions: Cognitive Behavioral Therapy, Solution-Oriented/Positive Psychology, and Insight-Oriented  Diagnosis:Major depressive disorder, recurrent, mild (HCC)  Generalized anxiety disorder  Plan: Please see Treatment plan in Therapy Charts with target date of 08/20/2021.  The patient has approved this treatment plan and is making progress towards goals.  Burtis Imhoff G Ebunoluwa Gernert, LCSW                  Juvencio Verdi G Renelda Kilian, LCSW

## 2021-07-21 ENCOUNTER — Ambulatory Visit (INDEPENDENT_AMBULATORY_CARE_PROVIDER_SITE_OTHER): Payer: No Typology Code available for payment source | Admitting: Psychology

## 2021-07-21 DIAGNOSIS — F330 Major depressive disorder, recurrent, mild: Secondary | ICD-10-CM

## 2021-07-21 DIAGNOSIS — F411 Generalized anxiety disorder: Secondary | ICD-10-CM

## 2021-07-21 NOTE — Progress Notes (Signed)
[place] Counselor/Therapist Progress Note ? ?Patient ID: Alexandria Schmidt, MRN: [id number],   ? ?Date: 07/21/2021 ? ?Time Spent: 60 minutes ? ?Treatment Type: Individual Therapy ? ?Reported Symptoms: history of depression and worry ? ?Mental Status Exam: ?Appearance:  Casual     ?Behavior: Appropriate  ?Motor: Normal  ?Speech/Language:  Normal Rate  ?Affect: Appropriate  ?Mood: normal  ?Thought process: normal  ?Thought content:   WNL  ?Sensory/Perceptual disturbances:   WNL  ?Orientation: oriented to person, place, time/date, and situation  ?Attention: Good  ?Concentration: Good  ?Memory: WNL  ?Fund of knowledge:  Good  ?Insight:   Good  ?Judgment:  Good  ?Impulse Control: Good  ? ?Risk Assessment: ?Danger to Self:  No ?Self-injurious Behavior: No ?Danger to Others: No ?Duty to Warn:no ?Physical Aggression / Violence:No  ?Access to Firearms a concern: No  ?Gang Involvement:No  ? ?Subjective: The patient attended a face-to-face individual therapy session via video visit.  The patient gave permission for this session to be on video on WebEx.  The patient was at her room alone and therapist was in the office.  The patient talked about getting ready for retirement and she is working at her job with a Engineer, manufacturing systems.  The patient reports that she feels more like a mentor now than she has and feels good about this.  She also talked about having some things planned for when she retires with taking care of her grandchild and also some church activities.  In addition she brought up some of her feelings about retiring and her husband still working and we processed this and I gave her some things to think about in regards to whether it was an issue with her giving up control.  The patient is doing very well and we made an appointment for 3 months. ? ?Interventions: Cognitive Behavioral Therapy, Solution-Oriented/Positive Psychology, and Insight-Oriented ? ?Diagnosis:Major depressive disorder, recurrent,  mild ([place]) ? ?Generalized anxiety disorder ? ?Plan: Please see Treatment plan in Therapy Charts with target date of 08/20/2021.  The patient has approved this treatment plan and is making progress towards goals. ? ?Tawonda Legaspi G Winnifred Dufford, LCSW ? ? ? ? ? ? ? ? ? ? ? ? ? ? ? ? ? ?Amjad Fikes G Camdon Saetern, LCSW ? ? ? ? ? ? ? ? ? ? ? ? ? ? ?Kairee Isa G Anniebelle Devore, LCSW ?

## 2021-09-29 IMAGING — MG DIGITAL DIAGNOSTIC BILAT W/ TOMO W/ CAD
8 series · 8 of 24 positions shown · non-contrast
Comparison: Previous exam(s).

CLINICAL DATA: Patient presents for a six-month follow-up
diagnostic right breast exam to follow-up probable posttraumatic fat
necrosis at the 9 o'clock position. Patient is due for her annual
bilateral mammogram.

EXAM:
DIGITAL DIAGNOSTIC BILATERAL MAMMOGRAM WITH TOMOSYNTHESIS AND CAD;
ULTRASOUND RIGHT BREAST LIMITED
TECHNIQUE: Bilateral digital diagnostic mammography and breast tomosynthesis
was performed. The images were evaluated with computer-aided
detection.; Targeted ultrasound examination of the right breast was
performed

[R MLO synth-2D]
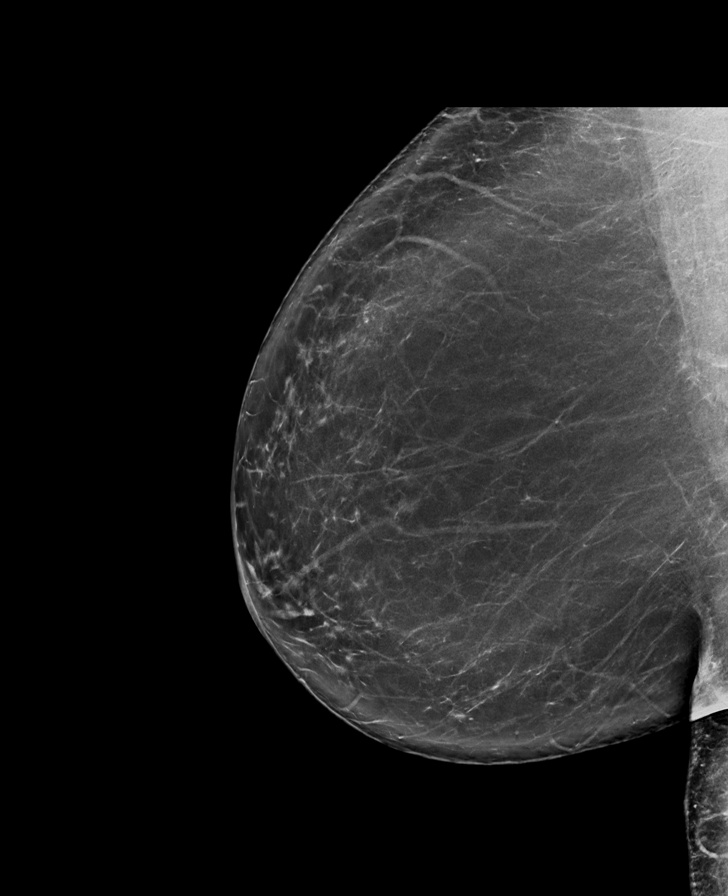

[L CC synth-2D]
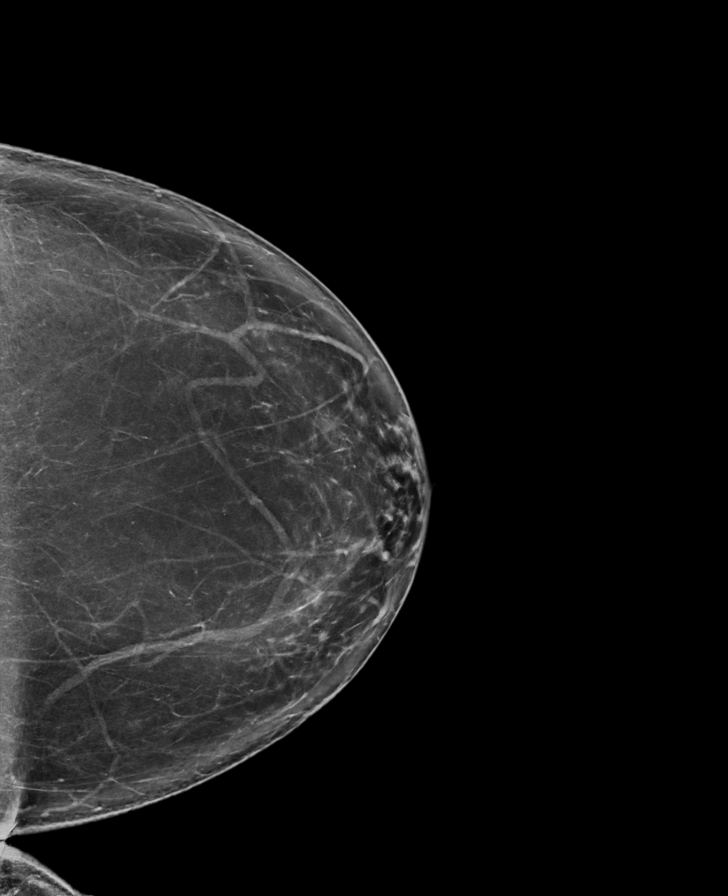

[R CC synth-2D]
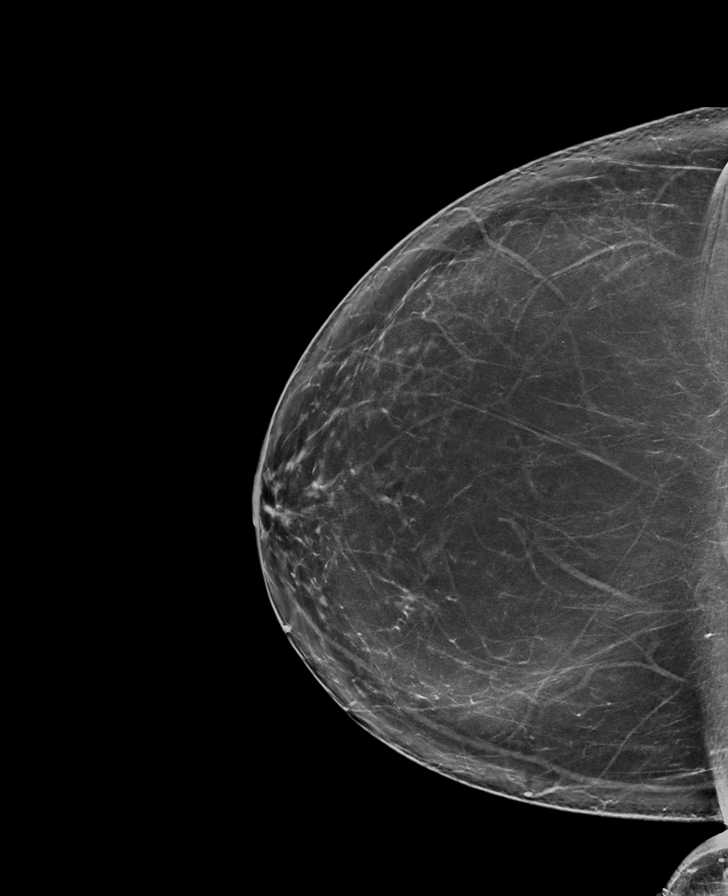

[L MLO synth-2D]
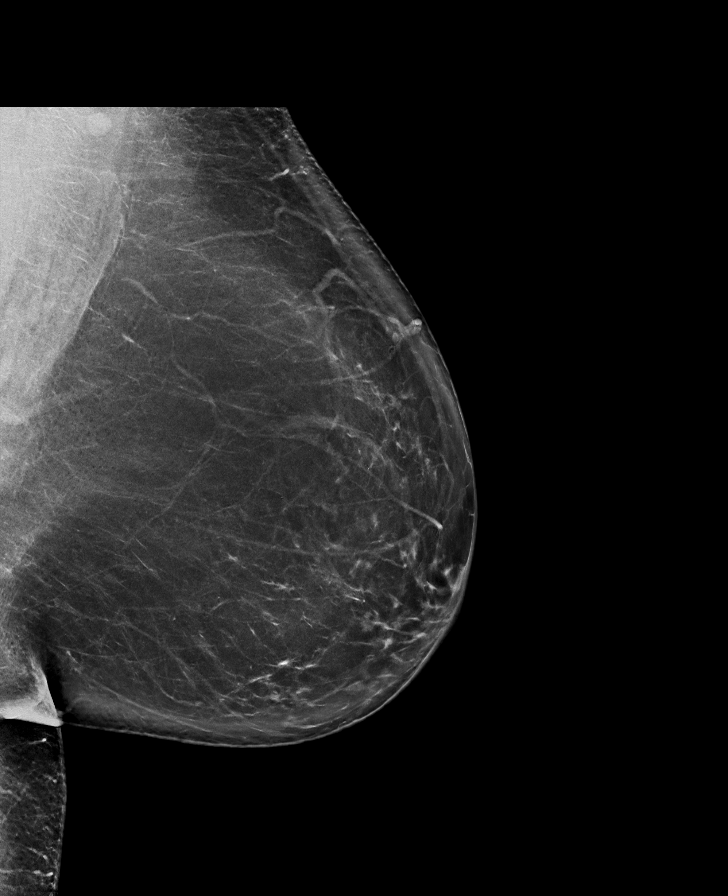

[R CC tomo · tomo slice 43/85.0]
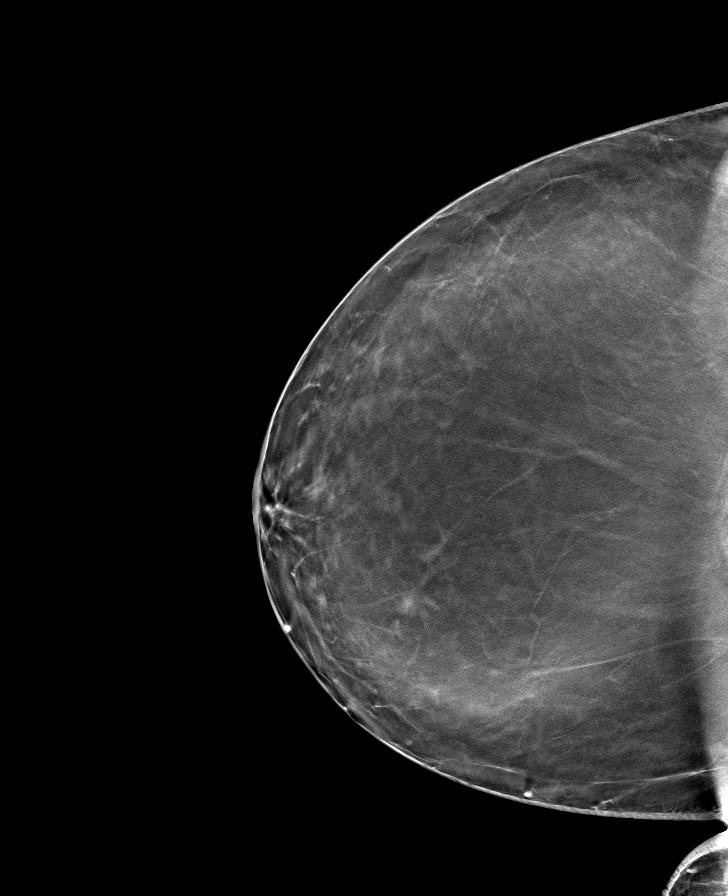

[L MLO tomo · tomo slice 47/94.0]
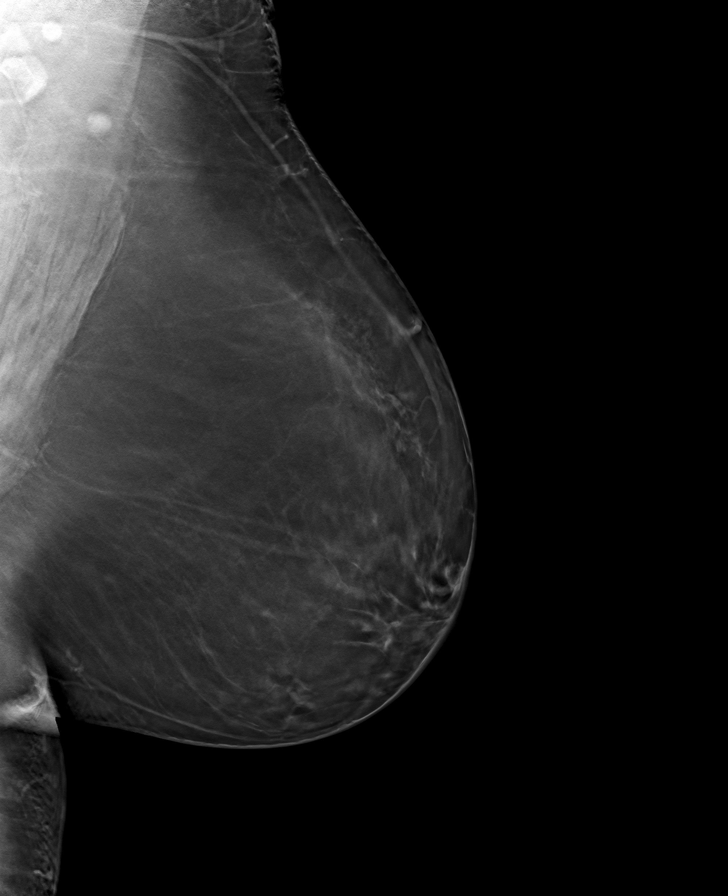

[L CC tomo · tomo slice 43/85.0]
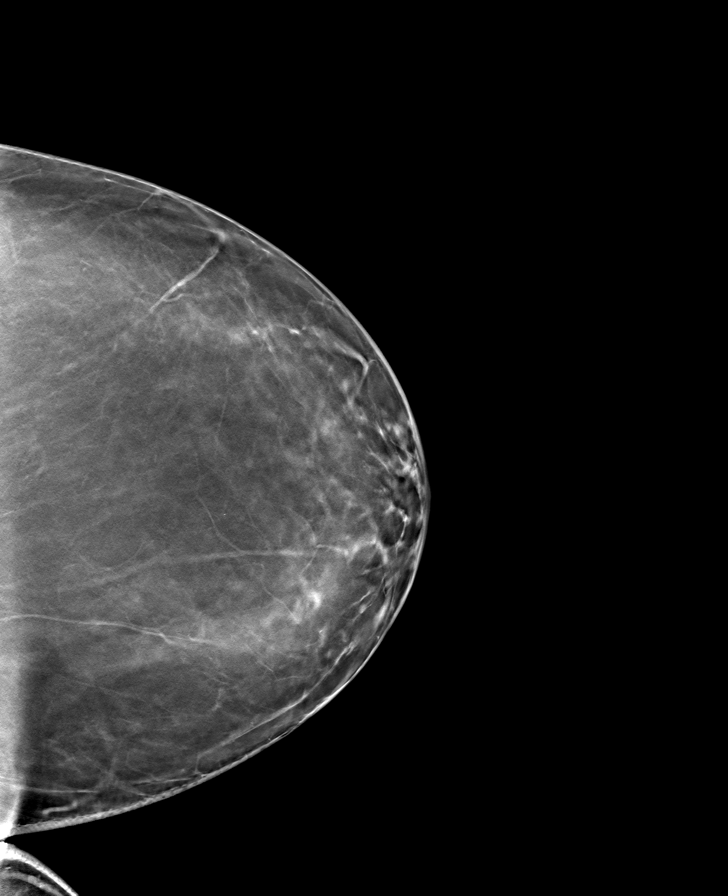

[R MLO tomo · tomo slice 47/92.0]
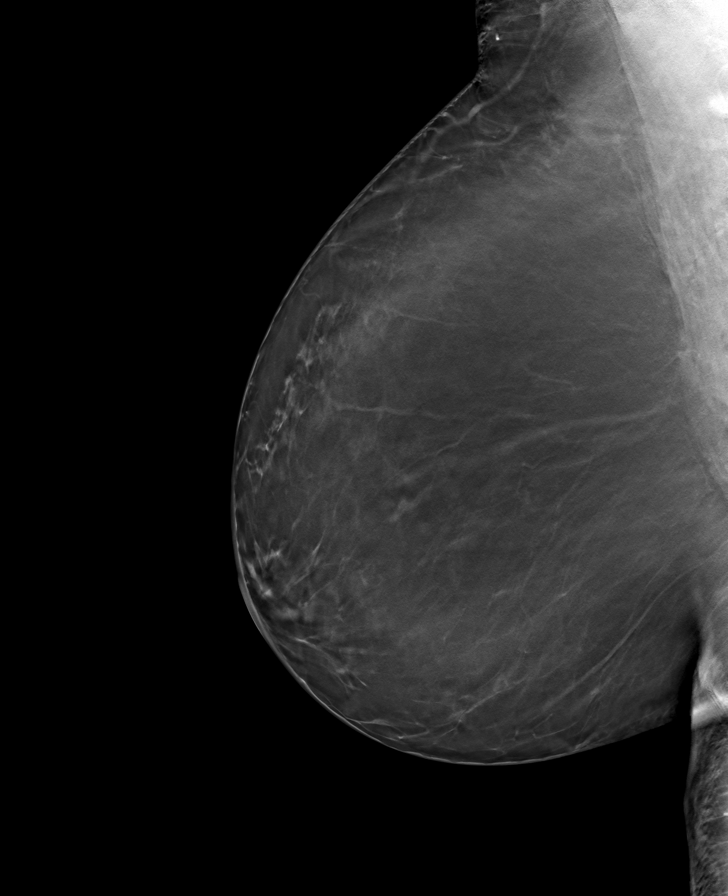

[8 of 24 positions shown; findings below may reference images not displayed]

ACR Breast Density Category b: There are scattered areas of
fibroglandular density.
FINDINGS: Examination demonstrates no focal abnormality within the outer right
breast. Remainder of the right breast as well as the left breast is
unchanged.

Targeted ultrasound is performed, showing no focal abnormality over
the 9 o'clock position of the right breast 12 cm from the nipple as
the previously seen presumed posttraumatic fat necrosis has
resolved.
IMPRESSION: Interval resolution of presumed posttraumatic fat necrosis over the
outer right breast. Otherwise, stable mammogram.

RECOMMENDATION:
Recommend continued annual bilateral screening mammographic
follow-up.

I have discussed the findings and recommendations with the patient.
If applicable, a reminder letter will be sent to the patient
regarding the next appointment.

BI-RADS CATEGORY  1: Negative.

## 2021-10-13 ENCOUNTER — Ambulatory Visit (INDEPENDENT_AMBULATORY_CARE_PROVIDER_SITE_OTHER): Payer: No Typology Code available for payment source | Admitting: Psychology

## 2021-10-13 DIAGNOSIS — F330 Major depressive disorder, recurrent, mild: Secondary | ICD-10-CM | POA: Diagnosis not present

## 2021-10-13 NOTE — Progress Notes (Signed)
[facility] Counselor/Therapist Progress Note  Patient ID: Norris Bodley, MRN: [id number],    Date: 10/13/2021  Time Spent: 60 minutes  Treatment Type: Individual Therapy  Reported Symptoms: history of depression and worry  Mental Status Exam: Appearance:  Casual     Behavior: Appropriate  Motor: Normal  Speech/Language:  Normal Rate  Affect: Appropriate  Mood: normal  Thought process: normal  Thought content:   WNL  Sensory/Perceptual disturbances:   WNL  Orientation: oriented to person, place, time/date, and situation  Attention: Good  Concentration: Good  Memory: WNL  Fund of knowledge:  Good  Insight:   Good  Judgment:  Good  Impulse Control: Good   Risk Assessment: Danger to Self:  No Self-injurious Behavior: No Danger to Others: No Duty to Warn:no Physical Aggression / Violence:No  Access to Firearms a concern: No  Gang Involvement:No   Subjective: The patient attended a face-to-face individual therapy session via video visit.  The patient gave permission for this session to be on video on WebEx.  The patient was at her home alone and therapist was in the office.  The patient presents as tearful and depressed today.  The patient reports that she has had a rough morning because she had a flat tire.  We started talking about her tearfulness and it seems that she may be stressed out about making this life transition with retiring.  The patient's plan is to retire on November 17.  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  We talked about how life changes in general are difficult and discussed Erickson's final 2 stages of psychosocial development.  The patient realizes that she is stressed and she is going to be stressed until she makes this transition over to retirement.  Some of her stress is related to concern about money and we talked about some ways for her to help herself feel better about this.  Scheduled another appointment  for product just prior to her retirement.  Interventions: Cognitive Behavioral Therapy, Solution-Oriented/Positive Psychology, and Insight-Oriented  Diagnosis:Major depressive disorder, recurrent, mild ([facility])  Plan:Client Abilities/Strengths  Intelligent, insightful, motivated  Client Treatment Preferences  Outpatient Individual therapy  Client Statement of Needs  "I need someone to hold me accountable to not overcommit myself"  Treatment Level  Outpatient individual therapy  Symptoms  Depressed or irritable mood.: No Description Entered (Status: improved). History of chronic or  recurrent depression for which the client has taken antidepressant medication, been hospitalized, had  outpatient treatment, or had a course of electroconvulsive therapy.: No Description Entered (Status:  maintained). Lack of energy.: No Description Entered (Status: improved).  Problems Addressed  Unipolar Depression  Goals 1. Develop healthy thinking patterns and beliefs about self, others, and the world that lead to the alleviation and help prevent the relapse of  depression. Objective Describe current and past experiences with depression including their impact on functioning and  attempts to resolve it. Target Date: 2022-08-21 Frequency: Monthly Progress: 70 Modality: individual Related Interventions 1. Encourage the client to share his/her thoughts and feelings of depression; express empathy and  build rapport while identifying primary cognitive, behavioral, interpersonal, or other  contributors to depression. Objective Identify and replace thoughts and beliefs that support depression. Target Date: 2022-08-21 Frequency: Monthly Progress: 70 Modality: individual Related Interventions 1. Explore and restructure underlying assumptions and beliefs reflected in biased self-talk that  may put the client at risk for relapse or recurrence. Objective Learn and implement behavioral strategies to overcome  depression. Target Date: 2022-08-21 Frequency: Monthly Progress: 70 Modality: individual Related Interventions 1. Assist the client in developing skills that increase the likelihood of deriving pleasure from  behavioral activation (e.g., assertiveness skills, developing an exercise plan, less internal/more  external focus, increased social involvement); reinforce success. Diagnosis Axis  none 296.31 (Major depressive affective disorder, recurrent episode, mild) - Open -  [Signifier: n/a]  Medications  1. Clonidine (Dosage: .5 mg qd)  2. Effexor (Dosage: 75mg  qd)  3. Wellbutrin (Dosage: 150 mg qd)  Conditions For Discharge Achievement of treatment goals and objectives  The patient has approved this treatment plan and is making progress towards goals.  Antonio Creswell G Dasan Hardman, LCSW                  Blandon Offerdahl G Anaid Haney, LCSW               Marcas Bowsher G Yacine Droz, LCSW               Ikran Patman G Emmanuelle Coxe, LCSW

## 2022-02-09 ENCOUNTER — Ambulatory Visit (INDEPENDENT_AMBULATORY_CARE_PROVIDER_SITE_OTHER): Payer: No Typology Code available for payment source | Admitting: Psychology

## 2022-02-09 DIAGNOSIS — F330 Major depressive disorder, recurrent, mild: Secondary | ICD-10-CM

## 2022-02-09 DIAGNOSIS — F411 Generalized anxiety disorder: Secondary | ICD-10-CM | POA: Diagnosis not present

## 2022-02-09 NOTE — Progress Notes (Signed)
[name] Counselor/Therapist Progress Note  Patient ID: Alexandria Schmidt, MRN: [id number],    Date: 02/09/2022  Time Spent: 60 minutes  Treatment Type: Individual Therapy  Reported Symptoms: history of depression and worry  Mental Status Exam: Appearance:  Casual     Behavior: Appropriate  Motor: Normal  Speech/Language:  Normal Rate  Affect: Appropriate  Mood: normal  Thought process: normal  Thought content:   WNL  Sensory/Perceptual disturbances:   WNL  Orientation: oriented to person, place, time/date, and situation  Attention: Good  Concentration: Good  Memory: WNL  Fund of knowledge:  Good  Insight:   Good  Judgment:  Good  Impulse Control: Good   Risk Assessment: Danger to Self:  No Self-injurious Behavior: No Danger to Others: No Duty to Warn:no Physical Aggression / Violence:No  Access to Firearms a concern: No  Gang Involvement:No   Subjective: The patient attended a face-to-face individual therapy session via video visit.  The patient gave permission for this session to be on video on WebEx.  The patient was at her home alone and therapist was in the office.  The patient reports that her family gave her retirement party and she was really surprised by that.  She says that she retires in a few weeks and she is very excited about this.  We talked about the progress that she has made and she is doing very  well handling herself.  She no longer takes on everybody else's responsibilities and is doing much better and setting limits and boundaries.  We talked about how retirement may be different than what she is doing now.  We talked about her continuing to keep a schedule and finding things that give her meaning.  We decided to go ahead and let her graduate now and she is aware that she can contact me if she needs to process something but she is doing very well right now and managing things.  At this point we did not schedule any future  appointments.  Interventions: Cognitive Behavioral Therapy, Solution-Oriented/Positive Psychology, and Insight-Oriented  Diagnosis:Major depressive disorder, recurrent, mild ([facility])  Generalized anxiety disorder  Plan:Client Abilities/Strengths  Intelligent, insightful, motivated  Client Treatment Preferences  Outpatient Individual therapy  Client Statement of Needs  "I need someone to hold me accountable to not overcommit myself"  Treatment Level  Outpatient individual therapy  Symptoms  Depressed or irritable mood.: No Description Entered (Status: improved). History of chronic or  recurrent depression for which the client has taken antidepressant medication, been hospitalized, had  outpatient treatment, or had a course of electroconvulsive therapy.: No Description Entered (Status:  maintained). Lack of energy.: No Description Entered (Status: improved).  Problems Addressed  Unipolar Depression  Goals 1. Develop healthy thinking patterns and beliefs about self, others, and the world that lead to the alleviation and help prevent the relapse of  depression. Objective Describe current and past experiences with depression including their impact on functioning and  attempts to resolve it. Target Date: 2022-08-21 Frequency: Monthly Progress: 70 Modality: individual Related Interventions 1. Encourage the client to share his/her thoughts and feelings of depression; express empathy and  build rapport while identifying primary cognitive, behavioral, interpersonal, or other  contributors to depression. Objective Identify and replace thoughts and beliefs that support depression. Target Date: 2022-08-21 Frequency: Monthly Progress: 70 Modality: individual Related Interventions 1. Explore and restructure underlying assumptions and beliefs reflected in biased self-talk that  may put the client at risk for relapse or recurrence. Objective Learn  and implement behavioral strategies to  overcome depression. Target Date: 2022-08-21 Frequency: Monthly Progress: 70 Modality: individual Related Interventions 1. Assist the client in developing skills that increase the likelihood of deriving pleasure from  behavioral activation (e.g., assertiveness skills, developing an exercise plan, less internal/more  external focus, increased social involvement); reinforce success. Diagnosis Axis  none 296.31 (Major depressive affective disorder, recurrent episode, mild) - Open -  [Signifier: n/a]  Medications  1. Clonidine (Dosage: .5 mg qd)  2. Effexor (Dosage: 75mg  qd)  3. Wellbutrin (Dosage: 150 mg qd)  Conditions For Discharge Achievement of treatment goals and objectives  The patient has approved this treatment plan and is making progress towards goals.  Joedy Eickhoff G Shilpa Bushee, LCSW

## 2023-10-15 ENCOUNTER — Ambulatory Visit (INDEPENDENT_AMBULATORY_CARE_PROVIDER_SITE_OTHER): Payer: PRIVATE HEALTH INSURANCE | Admitting: Psychology

## 2023-10-15 DIAGNOSIS — F4323 Adjustment disorder with mixed anxiety and depressed mood: Secondary | ICD-10-CM

## 2023-10-17 NOTE — Progress Notes (Signed)
 [facility] Counselor Initial Adult Exam  Name: Alexandria Schmidt Date: 10/15/2023 MRN: [id number] DOB: 1960-05-31 PCP: Clemmie Nest, MD  Time spent: 60 minutes  Time in:  11:00  Time out:  12:00  Guardian/Payee:  self   Paperwork requested: No   Reason for Visit /Presenting Problem: Patient reports feeling depressed because of a couple of issues that she is having with her retirement.  The patient was seen in the office today.  Mental Status Exam: Appearance:   Casual     Behavior:  Appropriate  Motor:  Normal  Speech/Language:   Clear and Coherent  Affect:  Tearful  Mood:  sad  Thought process:  normal  Thought content:    WNL  Sensory/Perceptual disturbances:    WNL  Orientation:  oriented to person, place, time/date, and situation  Attention:  Good  Concentration:  Good  Memory:  WNL  Fund of knowledge:   Good  Insight:    Good  Judgment:   Good  Impulse Control:  Good    Reported Symptoms:  sadness, tearful at times, anxiety  Risk Assessment: Danger to Self:  No Self-injurious Behavior: No Danger to Others: No Duty to Warn:no Physical Aggression / Violence:No  Access to Firearms a concern: No  Gang Involvement:No  Patient / guardian was educated about steps to take if suicide or homicide risk level increases between visits: no While future psychiatric events cannot be accurately predicted, the patient does not currently require acute inpatient psychiatric care and does not currently meet [facility]  involuntary commitment criteria.  Substance Abuse History: Current substance abuse: No     Past Psychiatric History:   Previous psychological history is significant for depression Outpatient Providers:none History of Psych Hospitalization: No  Psychological Testing: n/a  Abuse History:  Victim of: No., n/a  Report needed: No. Victim of Neglect:No. Perpetrator of n/a Witness / Exposure to Domestic Violence: No   Protective  Services Involvement: No  Witness to [employer] Violence:  No   Family History: No family history on file.  Living situation: the patient lives with their spouse  Sexual Orientation: Straight  Relationship Status: married  Name of spouse / other:John If a parent, number of children / ages:The patient has two grown children  Support Systems: spouse  Financial Stress:  No   Income/Employment/Disability: Neurosurgeon: No   Educational History: Education: post Engineer, maintenance (IT) work or degree  Religion/Sprituality/World View: Protestant  Any cultural differences that may affect / interfere with treatment:  not applicable   Recreation/Hobbies: reading  Stressors: Loss of church family   Marital or family conflict    Strengths: Supportive Relationships, Family, Friends, Charity fundraiser, Journalist, newspaper, and Able to Communicate Effectively  Barriers:  Long history with current church family that she is leaving  Legal History: Pending legal issue / charges: The patient has no significant history of legal issues. History of legal issue / charges: n/a  Medical History/Surgical History: reviewed No past medical history on file.  No past surgical history on file.  Medications: Current Outpatient Medications  Medication Sig Dispense Refill   amoxicillin (AMOXIL) 500 MG capsule Take 500 mg by mouth 3 (three) times daily.     betamethasone, augmented, (DIPROLENE) 0.05 % lotion betamethasone, augmented 0.05 % lotion     buPROPion (WELLBUTRIN XL) 150 MG 24 hr tablet Take 150 mg by mouth at bedtime.     cetirizine (ZYRTEC ALLERGY) 10 MG tablet Zyrtec 10 mg tablet  Take 1  tablet every day by oral route.     cloNIDine (CATAPRES) 0.1 MG tablet Take 0.1 mg by mouth at bedtime.     desvenlafaxine (PRISTIQ) 100 MG 24 hr tablet Pristiq 100 mg tablet,extended release     fluconazole (DIFLUCAN) 200 MG tablet      fluocinonide (LIDEX) 0.05 % external  solution      ketoconazole (NIZORAL) 2 % shampoo ketoconazole 2 % shampoo  USE AS DIRECTED     methylPREDNISolone (MEDROL DOSEPAK) 4 MG TBPK tablet Take by mouth.     minocycline (MINOCIN) 100 MG capsule Take by mouth daily.     mometasone (NASONEX) 50 MCG/ACT nasal spray mometasone 50 mcg/actuation nasal spray     montelukast (SINGULAIR) 10 MG tablet Take 10 mg by mouth at bedtime.     neomycin -polymyxin-hydrocortisone (CORTISPORIN) 3.5-10000-1 OTIC suspension SMARTSIG:In Ear(s)     neomycin -polymyxin-hydrocortisone (CORTISPORIN) OTIC solution Apply 2 drops once daily after bath or shower to right great toe 10 mL 0   nystatin cream (MYCOSTATIN) nystatin 100,000 unit/gram topical cream  MIX WITH EQUAL PARTS OF TRIAMCINOLONE AND APPLY TO AFFECTED AREA TWICE DAILY FOR 7 TO 10 DAYS AS NEEDED.     promethazine-dextromethorphan (PROMETHAZINE-DM) 6.25-15 MG/5ML syrup Take by mouth.     Sod Picosulfate-Mag Ox-Cit Acd (PREPOPIK) 10-3.5-12 MG-GM-GM PACK Prepopik 10 mg-3.5 gram-12 gram oral powder packet     testosterone cypionate (DEPOTESTOSTERONE CYPIONATE) 200 MG/ML injection testosterone cypionate 200 mg/mL intramuscular oil  Given:    GM     triamcinolone cream (KENALOG) 0.1 %      valACYclovir (VALTREX) 1000 MG tablet Take 1,000 mg by mouth 3 (three) times daily.     venlafaxine XR (EFFEXOR-XR) 75 MG 24 hr capsule Take 75 mg by mouth daily.     No current facility-administered medications for this visit.    No Known Allergies  Diagnoses:  Adjustment disorder with mixed anxiety and depressed mood  Plan of Care:Plan:Client Abilities/Strengths  Intelligent, insightful, motivated  Client Treatment Preference Outpatient Individual therapy  Client Statement of Needs  I need some help dealing with a few situations. Treatment Level  Outpatient individual therapy  Symptoms  Depressed mood.: No Description Entered (Status: improved). History of chronic or  recurrent depression for which the  client has taken antidepressant medication, been hospitalized, had  outpatient treatment, or had a course of electroconvulsive therapy.: No Description Entered (Status:  maintained). Lack of energy.: No Description Entered (Status: improved).  Problems Addressed  Adjustment disorder with mixed anxiety and depression Goals 1. Develop healthy thinking patterns and beliefs about self, others, and the world that lead to the alleviation and help prevent the relapse of  depression. Objective Describe current and past experiences with depression including their impact on functioning and  attempts to resolve it. Target Date: 10/14/2024 Frequency: biweekly to Monthly Progress: 70 Modality: individual Related Interventions 1. Encourage the client to share his/her thoughts  of depression; express empathy and  build rapport while identifying primary cognitive, behavioral, interpersonal, or other  contributors to depression. Objective Identify and replace thoughts and beliefs that support depression. Target Date: 10/14/2024 Frequency: biweekly to Monthly Progress: 0 Modality: individual Related Interventions 1. Explore and restructure underlying assumptions and beliefs reflected in biased self-talk that  may put the client at risk for relapse or recurrence. Objective assumptions and beliefs reflected in biased self-talk that  may put the client at risk for relapse or recurrence. Objective Learn and implement behavioral strategies to overcome depression. Target Date: 10/14/2024 Frequency:biweekly to  Monthly Progress: 0 Modality: individual Related Interventions 1. Assist the client in developing skills that increase the likelihood of deriving pleasure from  behavioral activation (e.g., assertiveness skills, developing an exercise plan, less internal/more  external focus, increased social involvement);  reinforce success. Diagnosis  Adjustment Disorder with mixed anxiety and  depression Medications  1. Clonidine (Dosage: .5 mg qd)  2. Effexor (Dosage: 75mg  qd)  3. Wellbutrin (Dosage: 150 mg qd)  Vernella Niznik G Marquize Seib, LCSW                                                                                     Addisen Chappelle G Kisa Fujii, LCSW                   Memphis Creswell G Anvita Hirata, LCSW

## 2023-10-30 ENCOUNTER — Ambulatory Visit (INDEPENDENT_AMBULATORY_CARE_PROVIDER_SITE_OTHER): Payer: PRIVATE HEALTH INSURANCE | Admitting: Psychology

## 2023-10-30 DIAGNOSIS — F4323 Adjustment disorder with mixed anxiety and depressed mood: Secondary | ICD-10-CM

## 2023-10-31 NOTE — Progress Notes (Signed)
 [facility] Counselor/Therapist Progress Note  Patient ID: Alexandria Schmidt, MRN: [id number],    Date: 10/30/2023  Time Spent: 60 minutes Time in:5:00  Time out: 6:00   Treatment Type: Individual Therapy  Reported Symptoms: tearfulness, anxiety and depression  Mental Status Exam: Appearance:  Casual     Behavior: Appropriate  Motor: Normal  Speech/Language:  Clear and Coherent  Affect: Blunt  Mood: sad  Thought process: normal  Thought content:   WNL  Sensory/Perceptual disturbances:   WNL  Orientation: oriented to person, place, and time/date  Attention: Good  Concentration: Good  Memory: WNL  Fund of knowledge:  Good  Insight:   Good  Judgment:  Good  Impulse Control: Good   Risk Assessment: Danger to Self:  No Self-injurious Behavior: No Danger to Others: No Duty to Warn:no Physical Aggression / Violence:No  Access to Firearms a concern: No  Gang Involvement:No   Subjective: The patient attended individual therapy session in the office today.  The patient presents as pleasant and cooperative.  The patient reports that she did have success in letting her church members know that they are leaving and she reports that there has been some tearfulness but she seems to be handling the situation well.  We talked today about her libido and concerns that she has about intimacy with her husband.  I also we processed that it sounds as if she has never really had a good sex life in terms of orgasm during intercourse.  We processed this further and it seems that she may be having some issues with intimacy because of a previous experience she had when she was 63 years old.  We talked about the need to possibly look at that with the EMDR and we will focus on that in the near future.  In addition I recommended that she read the book Sexual Awareness.  We also talked about some ideas for doing things that are intimate without actually having intercourse.  It does not seem  that her issue with intercourse is necessarily about having gone through menopause or having painful sex so we will look at this from the perspective of previous trauma or beliefs.  Interventions: Cognitive Behavioral Therapy, Assertiveness/Communication, Mindfulness Meditation, Eye Movement Desensitization and Reprocessing (EMDR), Insight-Oriented, and Interpersonal  Diagnosis:Adjustment disorder with mixed anxiety and depressed mood  Plan: Plan of Care:Plan:Client Abilities/Strengths  Intelligent, insightful, motivated  Client Treatment Preference Outpatient Individual therapy  Client Statement of Needs  I need some help dealing with a few situations. Treatment Level  Outpatient individual therapy  Symptoms  Depressed mood.: No Description Entered (Status: improved). History of chronic or  recurrent depression for which the client has taken antidepressant medication, been hospitalized, had  outpatient treatment, or had a course of electroconvulsive therapy.: No Description Entered (Status:  maintained). Lack of energy.: No Description Entered (Status: improved).  Problems Addressed  Adjustment disorder with mixed anxiety and depression Goals 1. Develop healthy thinking patterns and beliefs about self, others, and the world that lead to the alleviation and help prevent the relapse of  depression. Objective Describe current and past experiences with depression including their impact on functioning and  attempts to resolve it. Target Date: 10/14/2024 Frequency: biweekly to Monthly Progress: 70 Modality: individual Related Interventions 1. Encourage the client to share his/her thoughts  of depression; express empathy and  build rapport while identifying primary cognitive, behavioral, interpersonal, or other  contributors to depression. Objective Identify and replace thoughts and beliefs that support depression.  Target Date: 10/14/2024 Frequency: biweekly to Monthly Progress: 0  Modality: individual Related Interventions 1. Explore and restructure underlying assumptions and beliefs reflected in biased self-talk that  may put the client at risk for relapse or recurrence. Objective assumptions and beliefs reflected in biased self-talk that  may put the client at risk for relapse or recurrence. Objective Learn and implement behavioral strategies to overcome depression. Target Date: 10/14/2024 Frequency:biweekly to Monthly Progress: 0 Modality: individual Related Interventions 1. Assist the client in developing skills that increase the likelihood of deriving pleasure from  behavioral activation (e.g., assertiveness skills, developing an exercise plan, less internal/more  external focus, increased social involvement);  reinforce success. Diagnosis  Adjustment Disorder with mixed anxiety and depression Medications  1. Clonidine (Dosage: .5 mg qd)  2. Effexor (Dosage: 75mg  qd)  3. Wellbutrin (Dosage: 150 mg qd)  Amyria Komar G Bettina Warn, LCSW      Brevan Luberto G Breton Berns, LCSW                  Randle Shatzer G Katha Kuehne, LCSW

## 2023-11-28 ENCOUNTER — Ambulatory Visit (INDEPENDENT_AMBULATORY_CARE_PROVIDER_SITE_OTHER): Payer: PRIVATE HEALTH INSURANCE | Admitting: Psychology

## 2023-11-28 DIAGNOSIS — F4323 Adjustment disorder with mixed anxiety and depressed mood: Secondary | ICD-10-CM | POA: Diagnosis not present

## 2023-11-28 DIAGNOSIS — F411 Generalized anxiety disorder: Secondary | ICD-10-CM | POA: Diagnosis not present

## 2023-11-28 DIAGNOSIS — F330 Major depressive disorder, recurrent, mild: Secondary | ICD-10-CM | POA: Diagnosis not present
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
## 2023-12-05 ENCOUNTER — Ambulatory Visit (INDEPENDENT_AMBULATORY_CARE_PROVIDER_SITE_OTHER): Payer: PRIVATE HEALTH INSURANCE | Admitting: Psychology

## 2023-12-05 DIAGNOSIS — F4323 Adjustment disorder with mixed anxiety and depressed mood: Secondary | ICD-10-CM

## 2023-12-05 NOTE — Progress Notes (Signed)
 [facility] Counselor/Therapist Progress Note  Patient ID: Alexandria Schmidt, MRN: [id number],    Date: 12/05/2023  Time Spent: 60 minutes Time in:10:00  Time out: 11:00   Treatment Type: Individual Therapy  Reported Symptoms: tearfulness, anxiety and depression  Mental Status Exam: Appearance:  Casual     Behavior: Appropriate  Motor: Normal  Speech/Language:  Clear and Coherent  Affect: Blunt  Mood: sad  Thought process: normal  Thought content:   WNL  Sensory/Perceptual disturbances:   WNL  Orientation: oriented to person, place, and time/date  Attention: Good  Concentration: Good  Memory: WNL  Fund of knowledge:  Good  Insight:   Good  Judgment:  Good  Impulse Control: Good   Risk Assessment: Danger to Self:  No Self-injurious Behavior: No Danger to Others: No Duty to Warn:no Physical Aggression / Violence:No  Access to Firearms a concern: No  Gang Involvement:No   Subjective: The patient attended individual therapy session in the office today.  The patient presents with a blunted affect and mood is somewhat sad periodically during the session.  She did have moments of tearfulness.  The patient reports that she has struggled with leaving her church and we talked about how to move through her grief with that.  In addition she reports that she had missed read what her husband meant the last time and he did say that he potentially wanted to get back to the book that we I had recommended and that it was not a complete no that he did not want to pursue that.  She also talked about starting another project with looking at some [employer].  We also processed more today about what we are going to target with the EMDR and it does appear that the 2 things we are going to target first or definitely related to her perception about what sex is supposed to be like.  We came up with the negative cognitions, the pictures and the positive cognitions for these  targets.  Interventions: Cognitive Behavioral Therapy, Assertiveness/Communication, Mindfulness Meditation, Eye Movement Desensitization and Reprocessing (EMDR), Insight-Oriented, and Interpersonal  Diagnosis:Adjustment disorder with mixed anxiety and depressed mood  Plan: Plan of Care:Plan:Client Abilities/Strengths  Intelligent, insightful, motivated  Client Treatment Preference Outpatient Individual therapy  Client Statement of Needs  I need some help dealing with a few situations. Treatment Level  Outpatient individual therapy  Symptoms  Depressed mood.: No Description Entered (Status: improved). History of chronic or  recurrent depression for which the client has taken antidepressant medication, been hospitalized, had  outpatient treatment, or had a course of electroconvulsive therapy.: No Description Entered (Status:  maintained). Lack of energy.: No Description Entered (Status: improved).  Problems Addressed  Adjustment disorder with mixed anxiety and depression Goals 1. Develop healthy thinking patterns and beliefs about self, others, and the world that lead to the alleviation and help prevent the relapse of  depression. Objective Describe current and past experiences with depression including their impact on functioning and  attempts to resolve it. Target Date: 10/14/2024 Frequency: biweekly to Monthly Progress: 70 Modality: individual Related Interventions 1. Encourage the client to share his/her thoughts  of depression; express empathy and  build rapport while identifying primary cognitive, behavioral, interpersonal, or other  contributors to depression. Objective Identify and replace thoughts and beliefs that support depression. Target Date: 10/14/2024 Frequency: biweekly to Monthly Progress: 0 Modality: individual Related Interventions 1. Explore and restructure underlying assumptions and beliefs reflected in biased self-talk that  may put the  client at risk for  relapse or recurrence. Objective assumptions and beliefs reflected in biased self-talk that  may put the client at risk for relapse or recurrence. Objective Learn and implement behavioral strategies to overcome depression. Target Date: 10/14/2024 Frequency:biweekly to Monthly Progress: 0 Modality: individual Related Interventions 1. Assist the client in developing skills that increase the likelihood of deriving pleasure from  behavioral activation (e.g., assertiveness skills, developing an exercise plan, less internal/more  external focus, increased social involvement);  reinforce success. Diagnosis  Adjustment Disorder with mixed anxiety and depression  [name] G Larell Baney, LCSW

## 2024-01-01 ENCOUNTER — Ambulatory Visit (INDEPENDENT_AMBULATORY_CARE_PROVIDER_SITE_OTHER): Payer: PRIVATE HEALTH INSURANCE | Admitting: Psychology

## 2024-01-01 DIAGNOSIS — F4323 Adjustment disorder with mixed anxiety and depressed mood: Secondary | ICD-10-CM | POA: Diagnosis not present

## 2024-01-01 DIAGNOSIS — F411 Generalized anxiety disorder: Secondary | ICD-10-CM | POA: Diagnosis not present

## 2024-01-01 DIAGNOSIS — F330 Major depressive disorder, recurrent, mild: Secondary | ICD-10-CM
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
## 2024-01-07 ENCOUNTER — Ambulatory Visit (INDEPENDENT_AMBULATORY_CARE_PROVIDER_SITE_OTHER): Payer: PRIVATE HEALTH INSURANCE | Admitting: Psychology

## 2024-01-07 DIAGNOSIS — F411 Generalized anxiety disorder: Secondary | ICD-10-CM

## 2024-01-07 DIAGNOSIS — F4323 Adjustment disorder with mixed anxiety and depressed mood: Secondary | ICD-10-CM | POA: Diagnosis not present

## 2024-01-07 DIAGNOSIS — F330 Major depressive disorder, recurrent, mild: Secondary | ICD-10-CM | POA: Diagnosis not present

## 2024-01-07 NOTE — Progress Notes (Signed)
 [facility] Counselor/Therapist Progress Note  Patient ID: Alexandria Schmidt, MRN: [id number],    Date: 01/07/2024  Time Spent: 60 minutes Time in:10:00  Time out: 11:00   Treatment Type: Individual Therapy  Reported Symptoms: tearfulness, anxiety and depression  Mental Status Exam: Appearance:  Casual     Behavior: Appropriate  Motor: Normal  Speech/Language:  Clear and Coherent  Affect: Blunt  Mood: anxious  Thought process: normal  Thought content:   WNL  Sensory/Perceptual disturbances:   WNL  Orientation: oriented to person, place, and time/date  Attention: Good  Concentration: Good  Memory: WNL  Fund of knowledge:  Good  Insight:   Good  Judgment:  Good  Impulse Control: Good   Risk Assessment: Danger to Self:  No Self-injurious Behavior: No Danger to Others: No Duty to Warn:no Physical Aggression / Violence:No  Access to Firearms a concern: No  Gang Involvement:No   Subjective: The patient attended individual therapy session in the office today.  The patient presents with a blunted affect and mood is a little anxious today.  The patient states that she has not noticed any difference between her last session and now.  She also does report that she and her husband have not had sex between then and now either.  I explained that that was perfectly fine and that she would likely be noticing the difference when she was intimate with her husband.  We did the EMDR on the second thing that we decided to target which was the first time she had sex when she was 63 years old.  Her suds score for that picture was 10 and it decreased to 3 after we finish the EMDR.  We installed I can make my needs known.  Interventions: Cognitive Behavioral Therapy, Assertiveness/Communication, Mindfulness Meditation, Eye Movement Desensitization and Reprocessing (EMDR), Insight-Oriented, and Interpersonal  Diagnosis:Adjustment disorder with mixed anxiety and depressed  mood  Major depressive disorder, recurrent, mild  Generalized anxiety disorder  Plan: Plan of Care:Plan:Client Abilities/Strengths  Intelligent, insightful, motivated  Client Treatment Preference Outpatient Individual therapy  Client Statement of Needs  I need some help dealing with a few situations. Treatment Level  Outpatient individual therapy  Symptoms  Depressed mood.: No Description Entered (Status: improved). History of chronic or  recurrent depression for which the client has taken antidepressant medication, been hospitalized, had  outpatient treatment, or had a course of electroconvulsive therapy.: No Description Entered (Status:  maintained). Lack of energy.: No Description Entered (Status: improved).  Problems Addressed  Adjustment disorder with mixed anxiety and depression Goals 1. Develop healthy thinking patterns and beliefs about self, others, and the world that lead to the alleviation and help prevent the relapse of  depression. Objective Describe current and past experiences with depression including their impact on functioning and  attempts to resolve it. Target Date: 10/14/2024 Frequency: biweekly to Monthly Progress: 70 Modality: individual Related Interventions 1. Encourage the client to share his/her thoughts  of depression; express empathy and  build rapport while identifying primary cognitive, behavioral, interpersonal, or other  contributors to depression. Objective Identify and replace thoughts and beliefs that support depression. Target Date: 10/14/2024 Frequency: biweekly to Monthly Progress: 20 Modality: individual Related Interventions 1. Explore and restructure underlying assumptions and beliefs reflected in biased self-talk that  may put the client at risk for relapse or recurrence. Objective assumptions and beliefs reflected in biased self-talk that  may put the client at risk for relapse or recurrence. Objective Learn and implement  behavioral strategies  to overcome depression. Target Date: 10/14/2024 Frequency:biweekly to Monthly Progress: 0 Modality: individual Related Interventions 1. Assist the client in developing skills that increase the likelihood of deriving pleasure from  behavioral activation (e.g., assertiveness skills, developing an exercise plan, less internal/more  external focus, increased social involvement);  reinforce success. Diagnosis  Adjustment Disorder with mixed anxiety and depression  Ellicia Alix G Gerrell Tabet, LCSW

## 2024-01-15 ENCOUNTER — Ambulatory Visit (INDEPENDENT_AMBULATORY_CARE_PROVIDER_SITE_OTHER): Payer: PRIVATE HEALTH INSURANCE | Admitting: Psychology

## 2024-01-15 DIAGNOSIS — F330 Major depressive disorder, recurrent, mild: Secondary | ICD-10-CM

## 2024-01-15 DIAGNOSIS — F4323 Adjustment disorder with mixed anxiety and depressed mood: Secondary | ICD-10-CM

## 2024-01-15 DIAGNOSIS — F411 Generalized anxiety disorder: Secondary | ICD-10-CM

## 2024-01-17 NOTE — Progress Notes (Signed)
 [facility] Counselor/Therapist Progress Note  Patient ID: Alexandria Schmidt, MRN: [id number],    Date: 01/15/2024  Time Spent: 60 minutes Time in:11:04  Time out: 12:04   Treatment Type: Individual Therapy  Reported Symptoms: tearfulness, anxiety and depression  Mental Status Exam: Appearance:  Casual     Behavior: Appropriate  Motor: Normal  Speech/Language:  Clear and Coherent  Affect: Blunt  Mood: pleasant  Thought process: normal  Thought content:   WNL  Sensory/Perceptual disturbances:   WNL  Orientation: oriented to person, place, and time/date  Attention: Good  Concentration: Good  Memory: WNL  Fund of knowledge:  Good  Insight:   Good  Judgment:  Good  Impulse Control: Good   Risk Assessment: Danger to Self:  No Self-injurious Behavior: No Danger to Others: No Duty to Warn:no Physical Aggression / Violence:No  Access to Firearms a concern: No  Gang Involvement:No   Subjective: The patient attended individual therapy session in the office today.  The patient presents with a blunted affect and mood is pleasant.  The patient reports that she and her husband have had sex over the last few weeks and she does not seem to be counting anymore.  I told her to continue to notice how she is feeling and we can work with the EMDR a little more if necessary.  In addition we processed some of her beliefs spiritually and talked about her concerns over her niece who has had a brain tumor removed before but is pregnant and seems to have a recurrence of the brain tumor.  She did report that she got a prophecy from an lady who said things would be okay.  We talked about her moving forward with her faith and knowing that she can handle what comes her way. Interventions: Cognitive Behavioral Therapy, Assertiveness/Communication, Mindfulness Meditation, Eye Movement Desensitization and Reprocessing (EMDR), Insight-Oriented, and Interpersonal  Diagnosis:Adjustment  disorder with mixed anxiety and depressed mood  Major depressive disorder, recurrent, mild  Generalized anxiety disorder  Plan: Plan of Care:Plan:Client Abilities/Strengths  Intelligent, insightful, motivated  Client Treatment Preference Outpatient Individual therapy  Client Statement of Needs  I need some help dealing with a few situations. Treatment Level  Outpatient individual therapy  Symptoms  Depressed mood.: No Description Entered (Status: improved). History of chronic or  recurrent depression for which the client has taken antidepressant medication, been hospitalized, had  outpatient treatment, or had a course of electroconvulsive therapy.: No Description Entered (Status:  maintained). Lack of energy.: No Description Entered (Status: improved).  Problems Addressed  Adjustment disorder with mixed anxiety and depression Goals 1. Develop healthy thinking patterns and beliefs about self, others, and the world that lead to the alleviation and help prevent the relapse of  depression. Objective Describe current and past experiences with depression including their impact on functioning and  attempts to resolve it. Target Date: 10/14/2024 Frequency: biweekly to Monthly Progress: 70 Modality: individual Related Interventions 1. Encourage the client to share his/her thoughts  of depression; express empathy and  build rapport while identifying primary cognitive, behavioral, interpersonal, or other  contributors to depression. Objective Identify and replace thoughts and beliefs that support depression. Target Date: 10/14/2024 Frequency: biweekly to Monthly Progress: 30 Modality: individual Related Interventions 1. Explore and restructure underlying assumptions and beliefs reflected in biased self-talk that  may put the client at risk for relapse or recurrence. Objective assumptions and beliefs reflected in biased self-talk that  may put the client at risk for relapse or  recurrence. Objective Learn and implement behavioral strategies to overcome depression. Target Date: 10/14/2024 Frequency:biweekly to Monthly Progress: 10 Modality: individual Related Interventions 1. Assist the client in developing skills that increase the likelihood of deriving pleasure from  behavioral activation (e.g., assertiveness skills, developing an exercise plan, less internal/more  external focus, increased social involvement);  reinforce success. Diagnosis  Adjustment Disorder with mixed anxiety and depression  Xara Paulding G Roderick Sweezy, LCSW

## 2024-01-29 ENCOUNTER — Ambulatory Visit (INDEPENDENT_AMBULATORY_CARE_PROVIDER_SITE_OTHER): Payer: PRIVATE HEALTH INSURANCE | Admitting: Psychology

## 2024-01-29 DIAGNOSIS — F4323 Adjustment disorder with mixed anxiety and depressed mood: Secondary | ICD-10-CM

## 2024-01-30 NOTE — Progress Notes (Signed)
 [facility] Counselor/Therapist Progress Note  Patient ID: Alexandria Schmidt, MRN: [id number],    Date: 01/29/2024  Time Spent: 60 minutes Time in:5:00  Time out: 6:00   Treatment Type: Individual Therapy  Reported Symptoms: tearfulness, anxiety and depression  Mental Status Exam: Appearance:  Casual     Behavior: Appropriate  Motor: Normal  Speech/Language:  Clear and Coherent  Affect: Blunt  Mood: pleasant  Thought process: normal  Thought content:   WNL  Sensory/Perceptual disturbances:   WNL  Orientation: oriented to person, place, and time/date  Attention: Good  Concentration: Good  Memory: WNL  Fund of knowledge:  Good  Insight:   Good  Judgment:  Good  Impulse Control: Good   Risk Assessment: Danger to Self:  No Self-injurious Behavior: No Danger to Others: No Duty to Warn:no Physical Aggression / Violence:No  Access to Firearms a concern: No  Gang Involvement:No   Subjective: The patient attended individual therapy session in the office today.  The patient presents with a blunted affect and mood is pleasant.  The patient reports that she is continuing to do well with the EMDR that we did and that she and her husband are continuing to work on their sexual relationship.  She did say however that she is no longer counting during sex which I think is a very good sign for her.  She had a list of things that she wanted to discuss and we have to discussed all the things on her list.  The final thing was about Thanksgiving and we did some problem solving to help her figure out what would be the best way to handle that circumstance with her family.  The patient is making progress and should be ready to graduate in the near future.  Interventions: Cognitive Behavioral Therapy, Assertiveness/Communication, Mindfulness Meditation, Eye Movement Desensitization and Reprocessing (EMDR), Insight-Oriented, and Interpersonal  Diagnosis:No diagnosis found.  Plan:  Plan of Care:Plan:Client Abilities/Strengths  Intelligent, insightful, motivated  Client Treatment Preference Outpatient Individual therapy  Client Statement of Needs  I need some help dealing with a few situations. Treatment Level  Outpatient individual therapy  Symptoms  Depressed mood.: No Description Entered (Status: improved). History of chronic or  recurrent depression for which the client has taken antidepressant medication, been hospitalized, had  outpatient treatment, or had a course of electroconvulsive therapy.: No Description Entered (Status:  maintained). Lack of energy.: No Description Entered (Status: improved).  Problems Addressed  Adjustment disorder with mixed anxiety and depression Goals 1. Develop healthy thinking patterns and beliefs about self, others, and the world that lead to the alleviation and help prevent the relapse of  depression. Objective Describe current and past experiences with depression including their impact on functioning and  attempts to resolve it. Target Date: 10/14/2024 Frequency: biweekly to Monthly Progress: 80 Modality: individual Related Interventions 1. Encourage the client to share his/her thoughts  of depression; express empathy and  build rapport while identifying primary cognitive, behavioral, interpersonal, or other  contributors to depression. Objective Identify and replace thoughts and beliefs that support depression. Target Date: 10/14/2024 Frequency: biweekly to Monthly Progress: 70 Modality: individual Related Interventions 1. Explore and restructure underlying assumptions and beliefs reflected in biased self-talk that  may put the client at risk for relapse or recurrence. Objective assumptions and beliefs reflected in biased self-talk that  may put the client at risk for relapse or recurrence. Objective Learn and implement behavioral strategies to overcome depression. Target Date: 10/14/2024 Frequency:biweekly to  Monthly Progress:  70 Modality: individual Related Interventions 1. Assist the client in developing skills that increase the likelihood of deriving pleasure from  behavioral activation (e.g., assertiveness skills, developing an exercise plan, less internal/more  external focus, increased social involvement);  reinforce success. Diagnosis  Adjustment Disorder with mixed anxiety and depression  Jeovani Weisenburger G Maely Clements, LCSW

## 2024-02-27 ENCOUNTER — Ambulatory Visit: Payer: PRIVATE HEALTH INSURANCE | Admitting: Psychology

## 2024-02-27 DIAGNOSIS — F330 Major depressive disorder, recurrent, mild: Secondary | ICD-10-CM

## 2024-02-27 DIAGNOSIS — F411 Generalized anxiety disorder: Secondary | ICD-10-CM

## 2024-02-27 DIAGNOSIS — F4323 Adjustment disorder with mixed anxiety and depressed mood: Secondary | ICD-10-CM

## 2024-02-28 NOTE — Progress Notes (Addendum)
 [facility] Counselor/Therapist Progress Note  Patient ID: Melvena Vink, MRN: [id number],    Date: 02/27/2024  Time Spent: 57 minutes Time in:12:08  Time out: 1:05   Treatment Type: Individual Therapy  Reported Symptoms: tearfulness, anxiety and depression  Mental Status Exam: Appearance:  Casual     Behavior: Appropriate  Motor: Normal  Speech/Language:  Clear and Coherent  Affect: Blunt  Mood: sad  Thought process: normal  Thought content:   WNL  Sensory/Perceptual disturbances:   WNL  Orientation: oriented to person, place, and time/date  Attention: Good  Concentration: Good  Memory: WNL  Fund of knowledge:  Good  Insight:   Good  Judgment:  Good  Impulse Control: Good   Risk Assessment: Danger to Self:  No Self-injurious Behavior: No Danger to Others: No Duty to Warn:no Physical Aggression / Violence:No  Access to Firearms a concern: No  Gang Involvement:No   Subjective: The patient attended individual therapy session in the office today.  The patient presents with a blunted affect and mood is sad and tearful.  The patient reports that her niece is having difficulty with her brain tumor recurring.  She also lost a baby recently and the patient was struggling with her sadness about that.  The patient was asking whether I thought that she had reverted and I explained to her that no her feet feelings likely are normal.  We did talk about her needing to work on excepting other peoples problems as hers.  We talked about needing to detach a little bit and I gave her some examples of how to work with herself about that.  She does seem to be handling things okay and we do have another appointment scheduled and I did explain to her that I will send out a letter of where she can get therapy if needed after she graduates.  I do believe she would be fine and be able to manage things but I will give her some resources so that she can contact them if she needs  to. Interventions: Cognitive Behavioral Therapy, Assertiveness/Communication, Mindfulness Meditation, Eye Movement Desensitization and Reprocessing (EMDR), Insight-Oriented, and Interpersonal  Diagnosis:Adjustment disorder with mixed anxiety and depressed mood  Major depressive disorder, recurrent, mild  Generalized anxiety disorder  Plan: Plan of Care:Plan:Client Abilities/Strengths  Intelligent, insightful, motivated  Client Treatment Preference Outpatient Individual therapy  Client Statement of Needs  I need some help dealing with a few situations. Treatment Level  Outpatient individual therapy  Symptoms  Depressed mood.: No Description Entered (Status: improved). History of chronic or  recurrent depression for which the client has taken antidepressant medication, been hospitalized, had  outpatient treatment, or had a course of electroconvulsive therapy.: No Description Entered (Status:  maintained). Lack of energy.: No Description Entered (Status: improved).  Problems Addressed  Adjustment disorder with mixed anxiety and depression Goals 1. Develop healthy thinking patterns and beliefs about self, others, and the world that lead to the alleviation and help prevent the relapse of  depression. Objective Describe current and past experiences with depression including their impact on functioning and  attempts to resolve it. Target Date: 10/14/2024 Frequency: biweekly to Monthly Progress: 80 Modality: individual Related Interventions 1. Encourage the client to share his/her thoughts  of depression; express empathy and  build rapport while identifying primary cognitive, behavioral, interpersonal, or other  contributors to depression. Objective Identify and replace thoughts and beliefs that support depression. Target Date: 10/14/2024 Frequency: biweekly to Monthly Progress: 70 Modality: individual Related Interventions 1.  Explore and restructure underlying assumptions and  beliefs reflected in biased self-talk that  may put the client at risk for relapse or recurrence. Objective assumptions and beliefs reflected in biased self-talk that  may put the client at risk for relapse or recurrence. Objective Learn and implement behavioral strategies to overcome depression. Target Date: 10/14/2024 Frequency:biweekly to Monthly Progress: 70 Modality: individual Related Interventions 1. Assist the client in developing skills that increase the likelihood of deriving pleasure from  behavioral activation (e.g., assertiveness skills, developing an exercise plan, less internal/more  external focus, increased social involvement);  reinforce success. Diagnosis  Adjustment Disorder with mixed anxiety and depression  Birgitta Uhlir G Lazariah Savard, LCSW

## 2024-03-24 ENCOUNTER — Ambulatory Visit: Admitting: Psychology

## 2024-03-24 DIAGNOSIS — F4323 Adjustment disorder with mixed anxiety and depressed mood: Secondary | ICD-10-CM

## 2024-03-25 NOTE — Progress Notes (Signed)
 [facility] Counselor/Therapist Progress Note  Patient ID: Alexandria Schmidt, MRN: [id number],    Date: 03/24/2024  Time Spent: 60 minutes Time in: 11:00Time out: 12:00  Treatment Type: Individual Therapy  Reported Symptoms: tearfulness, anxiety and depression  Mental Status Exam: Appearance:  Casual     Behavior: Appropriate  Motor: Normal  Speech/Language:  Clear and Coherent  Affect: Blunt  Mood: pleasant  Thought process: normal  Thought content:   WNL  Sensory/Perceptual disturbances:   WNL  Orientation: oriented to person, place, and time/date  Attention: Good  Concentration: Good  Memory: WNL  Fund of knowledge:  Good  Insight:   Good  Judgment:  Good  Impulse Control: Good   Risk Assessment: Danger to Self:  No Self-injurious Behavior: No Danger to Others: No Duty to Warn:no Physical Aggression / Violence:No  Access to Firearms a concern: No  Gang Involvement:No   Subjective: The patient attended individual therapy session in the office today.  The patient presents with a blunted affect and mood is pleasant.  The patient reports that she feels like she is doing well.  We talked about the things she has going on in her life and I feel like she will be fine handling it moving forward.  We discussed how to get treatment after I retire if she should need it but I explained to her that I felt that she would be fine and that she would be able to use the skills that she has learned in therapy in order to be able to handle situations.  The patient is going to graduate today and she knows that she can contact me before March if she decides that she needs to be seen but I feel like she will be in good shape and she also has knowledge of how to find someone else if she needs to.  Interventions: Cognitive Behavioral Therapy, Assertiveness/Communication, Mindfulness Meditation, Eye Movement Desensitization and Reprocessing (EMDR), Insight-Oriented, and  Interpersonal  Diagnosis:Adjustment disorder with mixed anxiety and depressed mood  Plan: Plan of Care:Plan:Client Abilities/Strengths  Intelligent, insightful, motivated  Client Treatment Preference Outpatient Individual therapy  Client Statement of Needs  I need some help dealing with a few situations. Treatment Level  Outpatient individual therapy  Symptoms  Depressed mood.: No Description Entered (Status: improved). History of chronic or  recurrent depression for which the client has taken antidepressant medication, been hospitalized, had  outpatient treatment, or had a course of electroconvulsive therapy.: No Description Entered (Status:  maintained). Lack of energy.: No Description Entered (Status: improved).  Problems Addressed  Adjustment disorder with mixed anxiety and depression Goals 1. Develop healthy thinking patterns and beliefs about self, others, and the world that lead to the alleviation and help prevent the relapse of  depression. Objective Describe current and past experiences with depression including their impact on functioning and  attempts to resolve it. Target Date: 10/14/2024 Frequency: biweekly to Monthly Progress: 90 Modality: individual Related Interventions 1. Encourage the client to share his/her thoughts  of depression; express empathy and  build rapport while identifying primary cognitive, behavioral, interpersonal, or other  contributors to depression. Objective Identify and replace thoughts and beliefs that support depression. Target Date: 10/14/2024 Frequency: biweekly to Monthly Progress: 90 Modality: individual Related Interventions 1. Explore and restructure underlying assumptions and beliefs reflected in biased self-talk that  may put the client at risk for relapse or recurrence. Objective assumptions and beliefs reflected in biased self-talk that  may put the client at risk for  relapse or recurrence. Objective Learn and implement  behavioral strategies to overcome depression. Target Date: 10/14/2024 Frequency:biweekly to Monthly Progress: 90 Modality: individual Related Interventions 1. Assist the client in developing skills that increase the likelihood of deriving pleasure from  behavioral activation (e.g., assertiveness skills, developing an exercise plan, less internal/more  external focus, increased social involvement);  reinforce success. Diagnosis  Adjustment Disorder with mixed anxiety and depression  Lavontae Cornia G Earon Rivest, LCSW
